# Patient Record
Sex: Female | Born: 2000 | Race: Black or African American | Hispanic: No | Marital: Single | Smoking: Light tobacco smoker
Health system: Southern US, Community
[De-identification: ages and names within clinical notes are randomized; demographics above are authoritative.]

## PROBLEM LIST (undated history)

## (undated) DIAGNOSIS — Z789 Other specified health status: Secondary | ICD-10-CM

## (undated) DIAGNOSIS — E669 Obesity, unspecified: Secondary | ICD-10-CM

## (undated) HISTORY — DX: Other specified health status: Z78.9

## (undated) HISTORY — PX: HERNIA REPAIR: SHX51

## (undated) HISTORY — DX: Obesity, unspecified: E66.9

---
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
## 2019-05-30 ENCOUNTER — Ambulatory Visit: Payer: Medicaid Other | Attending: Internal Medicine

## 2019-05-30 DIAGNOSIS — Z23 Encounter for immunization: Secondary | ICD-10-CM

## 2019-05-30 NOTE — Progress Notes (Signed)
   Covid-19 Vaccination Clinic  Name:  Tina Campos    MRN: [id number] DOB: 2000/08/01  05/30/2019  [name] was observed post Covid-19 immunization for 15 minutes without incident. She was provided with Vaccine Information Sheet and instruction to access the V-Safe system.   [name] was instructed to call 911 with any severe reactions post vaccine: Marland Kitchen Difficulty breathing  . Swelling of face and throat  . A fast heartbeat  . A bad rash all over body  . Dizziness and weakness   Immunizations Administered    Name Date Dose VIS Date Route   Moderna COVID-19 Vaccine 05/30/2019  9:59 AM 0.5 mL 01/22/2019 Intramuscular   Manufacturer: Moderna   Lot: [id number]   NDC: 76808-811-03

## 2019-07-02 ENCOUNTER — Ambulatory Visit: Payer: Medicaid Other | Attending: Family

## 2019-07-02 DIAGNOSIS — Z23 Encounter for immunization: Secondary | ICD-10-CM

## 2019-07-02 NOTE — Progress Notes (Signed)
   Covid-19 Vaccination Clinic  Name:  Tina Campos    MRN: [id number] DOB: [birth year]  07/02/2019  [name] was observed post Covid-19 immunization for 15 minutes without incident. She was provided with Vaccine Information Sheet and instruction to access the V-Safe system.   [name] was instructed to call 911 with any severe reactions post vaccine: Marland Kitchen Difficulty breathing  . Swelling of face and throat  . A fast heartbeat  . A bad rash all over body  . Dizziness and weakness   Immunizations Administered    Name Date Dose VIS Date Route   Moderna COVID-19 Vaccine 07/02/2019 10:10 AM 0.5 mL 01/2019 Intramuscular   Manufacturer: Moderna   Lot: [id number]   NDC: 94320-037-94

## 2019-07-19 ENCOUNTER — Emergency Department (HOSPITAL_COMMUNITY)
Admission: EM | Admit: 2019-07-19 | Discharge: 2019-07-20 | Disposition: A | Discharge status: Home / Self Care | Payer: Medicaid Other | Attending: Emergency Medicine | Admitting: Emergency Medicine

## 2019-07-19 ENCOUNTER — Encounter (HOSPITAL_COMMUNITY): Payer: Self-pay

## 2019-07-19 ENCOUNTER — Other Ambulatory Visit: Payer: Self-pay

## 2019-07-19 DIAGNOSIS — H9203 Otalgia, bilateral: Secondary | ICD-10-CM | POA: Diagnosis not present

## 2019-07-19 DIAGNOSIS — Z5321 Procedure and treatment not carried out due to patient leaving prior to being seen by health care provider: Secondary | ICD-10-CM | POA: Diagnosis not present

## 2019-07-19 NOTE — ED Triage Notes (Signed)
Pt states that for the past 3 days she has been having bilateral ear pain that is causing her to have a headache.

## 2019-07-20 NOTE — ED Notes (Signed)
Called x 1 no answer

## 2019-07-20 NOTE — ED Notes (Signed)
Called x2 no answer, used overhead.

## 2019-09-08 ENCOUNTER — Other Ambulatory Visit: Payer: Self-pay

## 2019-09-08 ENCOUNTER — Emergency Department (HOSPITAL_COMMUNITY)
Admission: EM | Admit: 2019-09-08 | Discharge: 2019-09-09 | Disposition: A | Discharge status: Home / Self Care | Payer: Medicaid Other | Attending: Emergency Medicine | Admitting: Emergency Medicine

## 2019-09-08 DIAGNOSIS — Z79899 Other long term (current) drug therapy: Secondary | ICD-10-CM | POA: Insufficient documentation

## 2019-09-08 DIAGNOSIS — R1030 Lower abdominal pain, unspecified: Secondary | ICD-10-CM | POA: Diagnosis not present

## 2019-09-08 DIAGNOSIS — K921 Melena: Secondary | ICD-10-CM | POA: Diagnosis not present

## 2019-09-08 DIAGNOSIS — K625 Hemorrhage of anus and rectum: Secondary | ICD-10-CM | POA: Diagnosis present

## 2019-09-08 LAB — COMPREHENSIVE METABOLIC PANEL
ALT: 17 U/L (ref 0–44)
AST: 20 U/L (ref 15–41)
Albumin: 3.6 g/dL (ref 3.5–5.0)
Alkaline Phosphatase: 61 U/L (ref 38–126)
Anion gap: 8 (ref 5–15)
BUN: 6 mg/dL (ref 6–20)
CO2: 25 mmol/L (ref 22–32)
Calcium: 9.2 mg/dL (ref 8.9–10.3)
Chloride: 108 mmol/L (ref 98–111)
Creatinine, Ser: 0.8 mg/dL (ref 0.44–1.00)
GFR calc Af Amer: >60 mL/min (ref >60)
GFR calc non Af Amer: >60 mL/min (ref >60)
Glucose, Bld: 80 mg/dL (ref 70–99)
Potassium: 3.9 mmol/L (ref 3.5–5.1)
Sodium: 141 mmol/L (ref 135–145)
Total Bilirubin: 0.5 mg/dL (ref 0.3–1.2)
Total Protein: 6.8 g/dL (ref 6.5–8.1)

## 2019-09-08 LAB — CBC
HCT: 34.8 % — ABNORMAL LOW (ref 36.0–46.0)
Hemoglobin: 10.8 g/dL — ABNORMAL LOW (ref 12.0–15.0)
MCH: 28.3 pg (ref 26.0–34.0)
MCHC: 31 g/dL (ref 30.0–36.0)
MCV: 91.3 fL (ref 80.0–100.0)
Platelets: 269 10*3/uL (ref 150–400)
RBC: 3.81 MIL/uL — ABNORMAL LOW (ref 3.87–5.11)
RDW: 12.9 % (ref 11.5–15.5)
WBC: 6.2 10*3/uL (ref 4.0–10.5)
nRBC: 0 % (ref 0.0–0.2)

## 2019-09-08 LAB — TYPE AND SCREEN
ABO/RH(D): O POS
Antibody Screen: NEGATIVE

## 2019-09-08 LAB — I-STAT BETA HCG BLOOD, ED (MC, WL, AP ONLY): I-stat hCG, quantitative: <5 m[IU]/mL (ref <5)

## 2019-09-08 LAB — ABO/RH: ABO/RH(D): O POS

## 2019-09-08 NOTE — ED Triage Notes (Signed)
Pt. Stated, Im havin blood in my stool.I saw it this morning

## 2019-09-09 LAB — CBC
HCT: 35.8 % — ABNORMAL LOW (ref 36.0–46.0)
Hemoglobin: 11.3 g/dL — ABNORMAL LOW (ref 12.0–15.0)
MCH: 29.2 pg (ref 26.0–34.0)
MCHC: 31.6 g/dL (ref 30.0–36.0)
MCV: 92.5 fL (ref 80.0–100.0)
Platelets: 289 10*3/uL (ref 150–400)
RBC: 3.87 MIL/uL (ref 3.87–5.11)
RDW: 13 % (ref 11.5–15.5)
WBC: 7.7 10*3/uL (ref 4.0–10.5)
nRBC: 0 % (ref 0.0–0.2)

## 2019-09-09 NOTE — ED Notes (Addendum)
Pt to rm35, complaining of rectal bleeding that started Sunday (7/18) morning. Pt claims that she had salad for dinner Saturday night, & around 0200 Sunday morning she ate approx half a bag of flaming hot cheetos. Pt states that she had 2 episodes of blood in stool Sunday (7/19). States that LMP ended 3 days ago (7/16)

## 2019-09-09 NOTE — ED Notes (Signed)
Pt unable to provide stool specimen at this time.

## 2019-09-09 NOTE — ED Notes (Signed)
Patient verbalizes understanding of discharge instructions. Opportunity for questioning and answers were provided. Armband removed by staff, pt discharged from ED stable & ambulatory  

## 2019-09-09 NOTE — Discharge Instructions (Addendum)
Your blood counts are reassuring.  If your symptoms change or worsen, please return to the ER.

## 2019-09-09 NOTE — ED Provider Notes (Signed)
[facility] EMERGENCY DEPARTMENT Provider Note   CSN: [id number] Arrival date & time: 09/08/19  1453     History Chief Complaint  Patient presents with  . Rectal Bleeding  . Abdominal Pain    Tina Campos is a 19 y.o. female.  Patient presents to the emergency department with a chief complaint of bloody stools.  She states that she had 2 episodes of bloody stool earlier today.  She reports some lower abdominal cramping.  She denies any fever, chills, nausea, or vomiting.  She denies any recent illnesses or exposures to anyone else that is sick.  She does report eating flaming hot she does yesterday.  She denies any other changes to her diet.  She denies any treatment prior to arrival.  The history is provided by the patient. No language interpreter was used.       Past Medical History:  Diagnosis Date  . Medical history non-contributory   . Obesity     Patient Active Problem List   Diagnosis Date Noted  . Adjustment disorder with depressed mood 01/07/2015  . Eczema 04/04/2014  . Recurrent boils 04/04/2014  . Unspecified constipation 03/28/2013  . Body mass index, pediatric, greater than or equal to 95th percentile for age 53/06/2013    Past Surgical History:  Procedure Laterality Date  . UMBILICAL HERNIA REPAIR  2005     OB History   No obstetric history on file.     Family History  Problem Relation Age of Onset  . Cancer Mother        cervical cancer    Social History   Tobacco Use  . Smoking status: Never Smoker  . Smokeless tobacco: Never Used  Substance Use Topics  . Alcohol use: No  . Drug use: Not Currently    Home Medications Prior to Admission medications   Medication Sig Start Date End Date Taking? Authorizing Provider  acetaminophen (TYLENOL) 500 MG tablet Take 1,000 mg by mouth daily as needed for mild pain or headache.   Yes [provider]  azelastine (ASTELIN) 0.1 % nasal spray Place 2 sprays into both  nostrils 2 (two) times daily. 07/20/19  Yes [provider]  Isopropyl Alcohol (SWIMMERS EAR DROPS OT) Place 5 drops in ear(s) as needed (congested ears).   Yes [provider]  Multiple Vitamin (MULTIVITAMIN WITH MINERALS) TABS tablet Take 1 tablet by mouth daily.   Yes [provider]  fluticasone (FLONASE) 50 MCG/ACT nasal spray Place 1 spray into both nostrils daily. 1 spray in each nostril every day Patient not taking: Reported on 04/04/2014 06/05/13   Jonetta Osgood, MD  hydrocortisone 2.5 % ointment Apply topically 2 (two) times daily as needed. for eczema Patient not taking: Reported on 01/07/2015 04/04/14   Tyrone Nine, MD  ibuprofen (ADVIL,MOTRIN) 600 MG tablet Take 1 tablet (600 mg total) by mouth every 8 (eight) hours as needed for cramping. Patient not taking: Reported on 01/07/2015 08/19/14   Perez-Fiery, Angelique Blonder, MD  ondansetron (ZOFRAN ODT) 4 MG disintegrating tablet Take 1 tablet (4 mg total) by mouth every 8 (eight) hours as needed for nausea or vomiting. Patient not taking: Reported on 09/09/2019 04/18/18   Roxy Horseman, PA-C  polyethylene glycol powder (GLYCOLAX/MIRALAX) powder Take 17 g by mouth daily. Patient not taking: Reported on 04/04/2014 03/28/13   Jonetta Osgood, MD  Vitamin D, Ergocalciferol, (DRISDOL) 50000 UNITS CAPS capsule Take 1 capsule (50,000 Units total) by mouth every 7 (seven) days. Patient not  taking: Reported on 09/09/2019 01/29/15   Jonetta Osgood, MD    Allergies    Patient has no known allergies.  Review of Systems   Review of Systems  All other systems reviewed and are negative.   Physical Exam Updated Vital Signs BP 118/73 (BP Location: Right Arm)   Pulse 62   Temp 98.3 F (36.8 C) (Oral)   Resp 17   Ht 5\' 4"  (1.626 m)   Wt 99.8 kg   LMP 09/05/2019   SpO2 100%   BMI 37.76 kg/m   Physical Exam Vitals and nursing note reviewed.  Constitutional:      General: She is not in acute distress.    Appearance: She is  well-developed.  HENT:     Head: Normocephalic and atraumatic.  Eyes:     Conjunctiva/sclera: Conjunctivae normal.  Cardiovascular:     Rate and Rhythm: Normal rate and regular rhythm.     Heart sounds: No murmur heard.   Pulmonary:     Effort: Pulmonary effort is normal. No respiratory distress.     Breath sounds: Normal breath sounds.  Abdominal:     Palpations: Abdomen is soft.     Tenderness: There is no abdominal tenderness.     Comments: No focal abdominal tenderness, no RLQ tenderness or pain at McBurney's point, no RUQ tenderness or Murphy's sign, no left-sided abdominal tenderness, no fluid wave, or signs of peritonitis   Musculoskeletal:     Cervical back: Neck supple.  Skin:    General: Skin is warm and dry.  Neurological:     Mental Status: She is alert and oriented to person, place, and time.  Psychiatric:        Mood and Affect: Mood normal.        Behavior: Behavior normal.     ED Results / Procedures / Treatments   Labs (all labs ordered are listed, but only abnormal results are displayed) Labs Reviewed  CBC - Abnormal; Notable for the following components:      Result Value   RBC 3.81 (*)    Hemoglobin 10.8 (*)    HCT 34.8 (*)    All other components within normal limits  CBC - Abnormal; Notable for the following components:   Hemoglobin 11.3 (*)    HCT 35.8 (*)    All other components within normal limits  GASTROINTESTINAL PANEL BY PCR, STOOL (REPLACES STOOL CULTURE)  COMPREHENSIVE METABOLIC PANEL  I-STAT BETA HCG BLOOD, ED ([facility], [facility], [facility] ONLY)  POC OCCULT BLOOD, ED  TYPE AND SCREEN  ABO/RH    EKG None  Radiology No results found.  Procedures Procedures (including critical care time)  Medications Ordered in ED Medications - No data to display  ED Course  I have reviewed the triage vital signs and the nursing notes.  Pertinent labs & imaging results that were available during my care of the patient were reviewed by me and considered in  my medical decision making (see chart for details).    MDM Rules/Calculators/A&P                          This patient complains of bloody stools, this involves an extensive number of treatment options, and is a complaint that carries with it a high risk of complications and morbidity.  The differential diagnosis includes colitis, diverticulitis, infectious diarrhea.  Pertinent Labs I ordered, reviewed, and interpreted labs, which included CBC notable for mild anemia, hemoglobin is 10.8,  but patient's baseline is in the low 11's.  No leukocytosis.  Electrolytes are reassuring.  Plan: We will recheck CBC given that the patient has been here for approximately 10 hours.  Patient needs to use the restroom now, will collect stool specimen and sent to lab.  If CBC is unchanged, plan for discharge to home.  Given patient is nontoxic appearance and reassuring blood counts, I suspect that the patient can likely be discharged home safely.  Her symptoms could be GI irritation from the flaming hot she does she ate.  Recommend close follow-up with PCP.  Repeat CBC shows hemoglobin of 11.3, which is baseline for the patient.  She was unable to provide a stool sample.  Final Clinical Impression(s) / ED Diagnoses Final diagnoses:  Bloody stool    Rx / DC Orders ED Discharge Orders    None       Roxy Horseman, PA-C 09/09/19 0203    Ward, Layla Maw, DO 09/09/19 [phone]

## 2019-10-14 ENCOUNTER — Emergency Department (HOSPITAL_COMMUNITY)
Admission: EM | Admit: 2019-10-14 | Discharge: 2019-10-14 | Disposition: A | Discharge status: Home / Self Care | Payer: Medicaid Other | Attending: Emergency Medicine | Admitting: Emergency Medicine

## 2019-10-14 ENCOUNTER — Other Ambulatory Visit: Payer: Self-pay

## 2019-10-14 DIAGNOSIS — Z5321 Procedure and treatment not carried out due to patient leaving prior to being seen by health care provider: Secondary | ICD-10-CM | POA: Insufficient documentation

## 2019-10-14 DIAGNOSIS — R109 Unspecified abdominal pain: Secondary | ICD-10-CM | POA: Diagnosis present

## 2019-10-14 NOTE — ED Triage Notes (Signed)
Patient states that she has had stomach issues over the past month, every time she eats she goes to the bathroom soon after 2-3 times, mix of diarrhea and hard stool. Multiple negative pregnancy tests, last period August 11th.

## 2020-01-30 IMAGING — CR CHEST - 2 VIEW
2 series · 2 of 2 positions shown · non-contrast
Comparison: None.

CLINICAL DATA: Chest pain

EXAM:
CHEST - 2 VIEW

[chest pa]
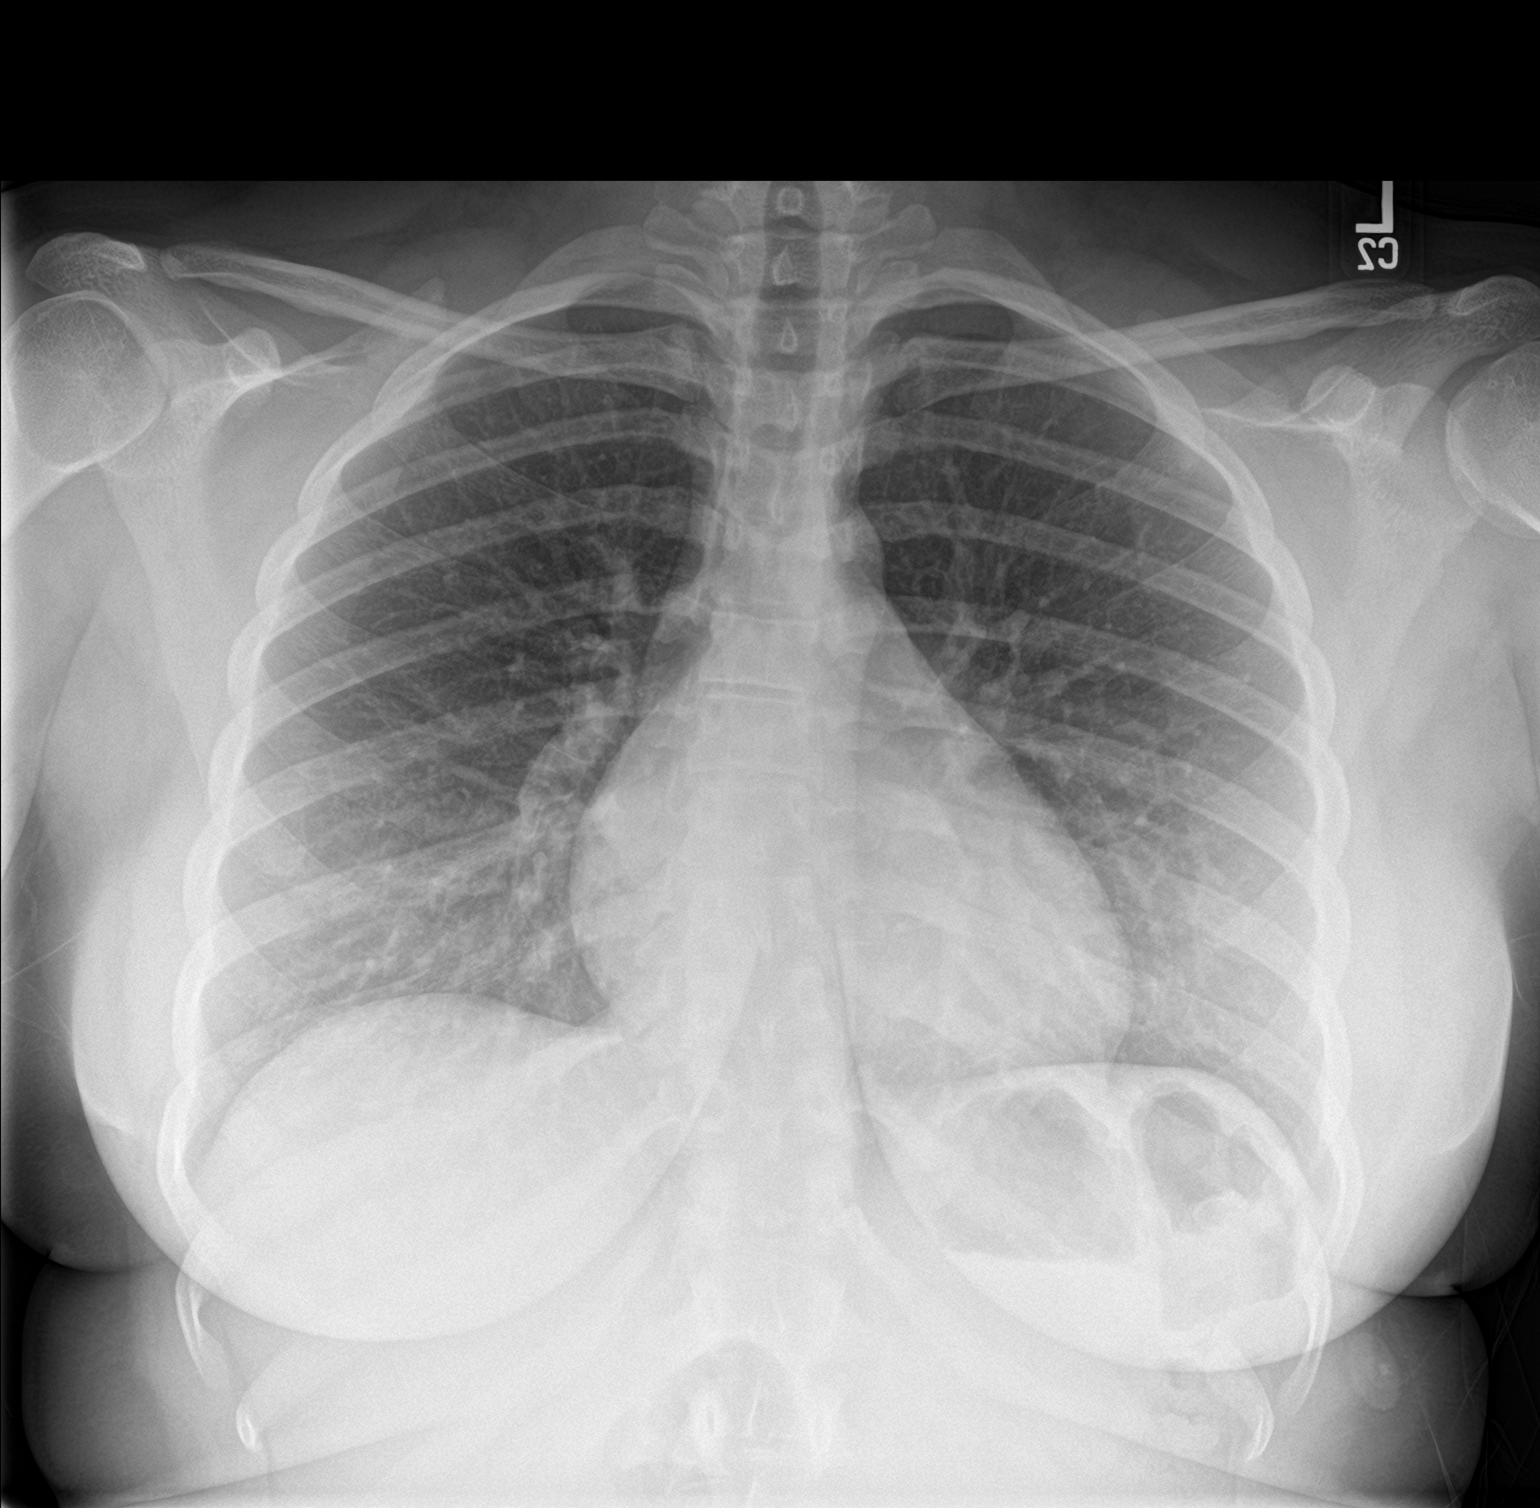

[chest lat]
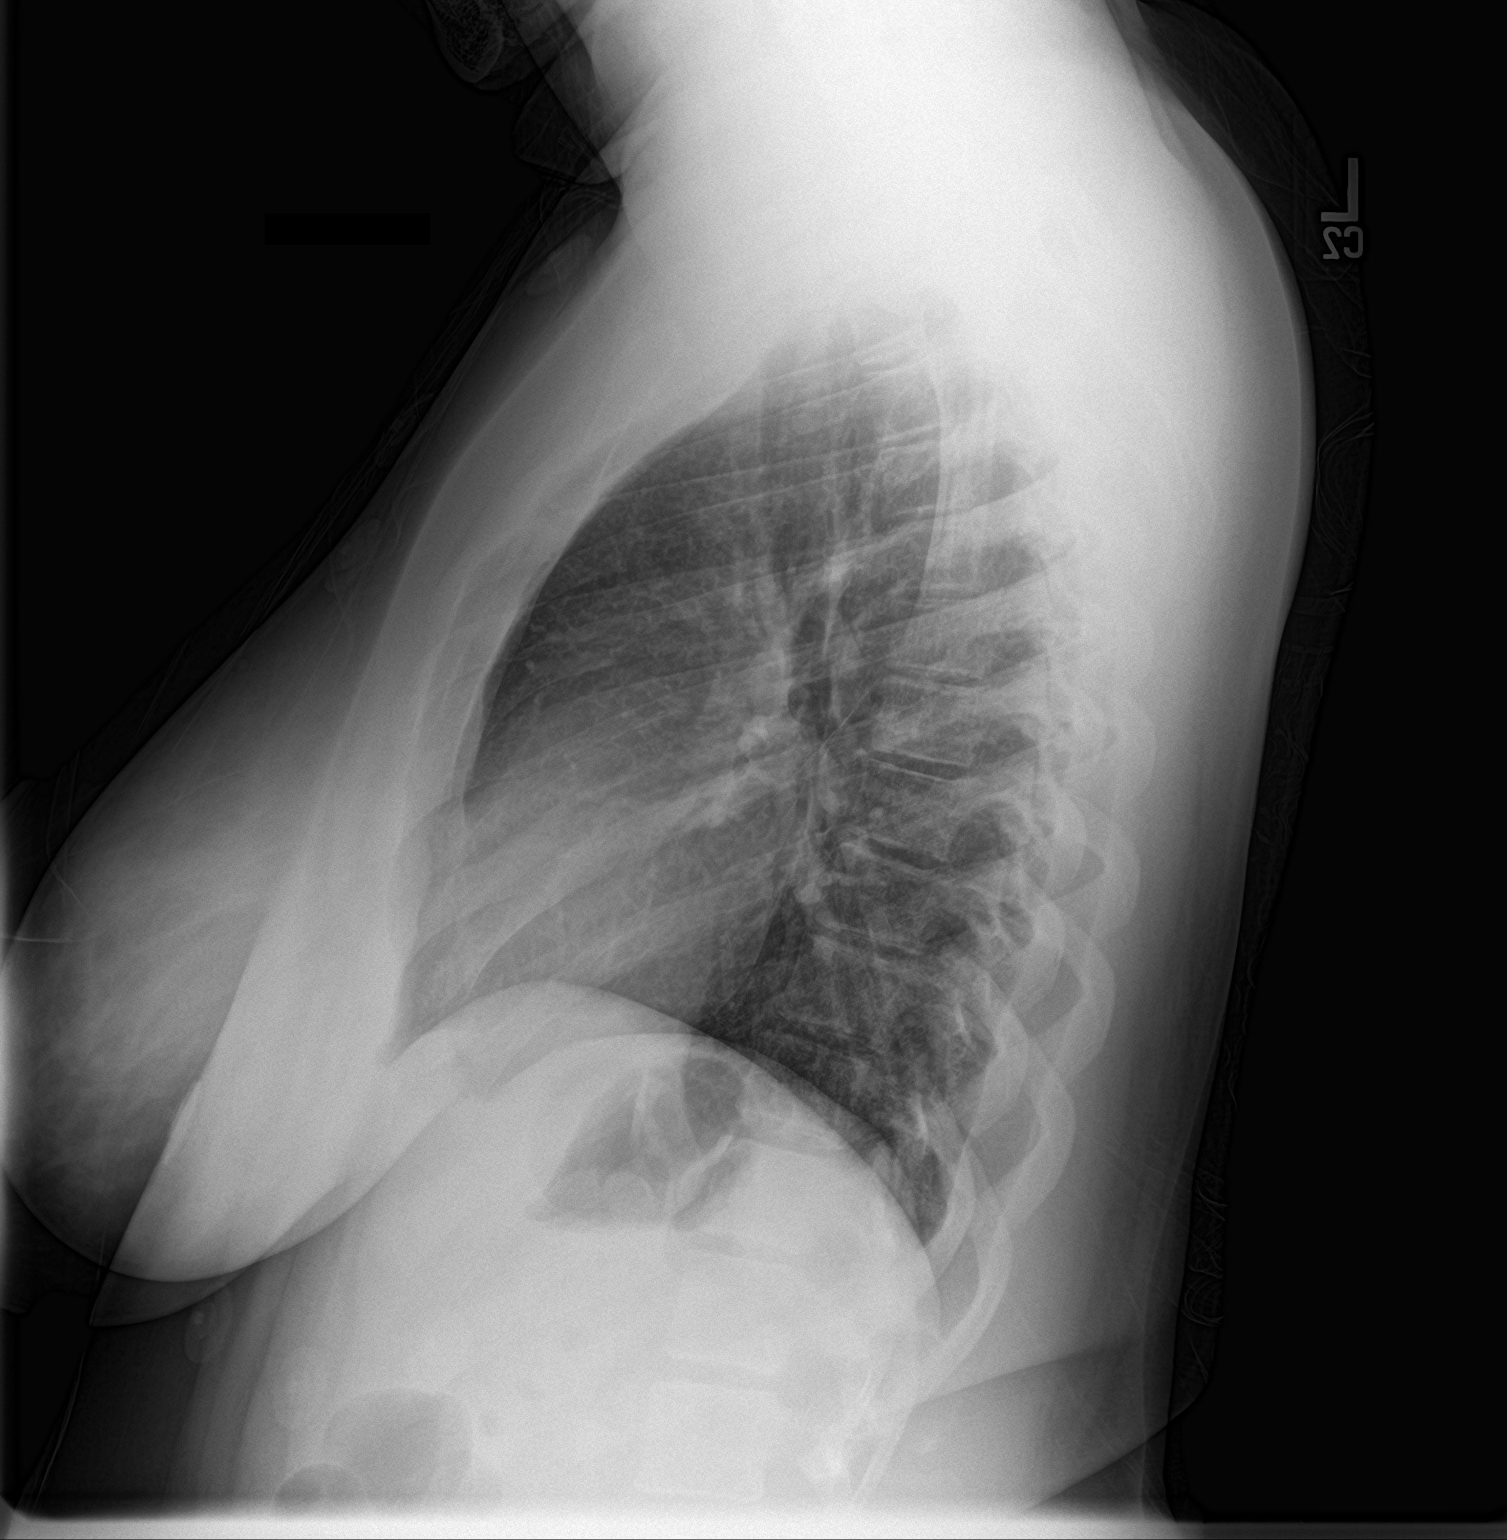

[2 of 2 positions shown; findings below may reference images not displayed]

FINDINGS: The heart size and mediastinal contours are within normal limits.
Both lungs are clear. The visualized skeletal structures are
unremarkable.
IMPRESSION: No active cardiopulmonary disease.

## 2020-06-14 ENCOUNTER — Encounter (HOSPITAL_COMMUNITY): Payer: Self-pay | Admitting: Emergency Medicine

## 2020-06-14 ENCOUNTER — Inpatient Hospital Stay (HOSPITAL_COMMUNITY)
Admission: AD | Admit: 2020-06-14 | Discharge: 2020-06-14 | Disposition: A | Discharge status: Home / Self Care | Payer: Medicaid Other | Attending: Obstetrics & Gynecology | Admitting: Obstetrics & Gynecology

## 2020-06-14 ENCOUNTER — Other Ambulatory Visit: Payer: Self-pay

## 2020-06-14 ENCOUNTER — Ambulatory Visit (HOSPITAL_COMMUNITY)
Admission: EM | Admit: 2020-06-14 | Discharge: 2020-06-14 | Disposition: A | Discharge status: Home / Self Care | Payer: Medicaid Other | Attending: Family Medicine | Admitting: Family Medicine

## 2020-06-14 DIAGNOSIS — R1031 Right lower quadrant pain: Secondary | ICD-10-CM | POA: Insufficient documentation

## 2020-06-14 DIAGNOSIS — N6452 Nipple discharge: Secondary | ICD-10-CM | POA: Diagnosis not present

## 2020-06-14 DIAGNOSIS — R109 Unspecified abdominal pain: Secondary | ICD-10-CM | POA: Diagnosis present

## 2020-06-14 DIAGNOSIS — R1032 Left lower quadrant pain: Secondary | ICD-10-CM | POA: Diagnosis not present

## 2020-06-14 DIAGNOSIS — N644 Mastodynia: Secondary | ICD-10-CM | POA: Diagnosis not present

## 2020-06-14 DIAGNOSIS — R519 Headache, unspecified: Secondary | ICD-10-CM | POA: Diagnosis not present

## 2020-06-14 DIAGNOSIS — N926 Irregular menstruation, unspecified: Secondary | ICD-10-CM | POA: Diagnosis not present

## 2020-06-14 DIAGNOSIS — Z3202 Encounter for pregnancy test, result negative: Secondary | ICD-10-CM | POA: Diagnosis not present

## 2020-06-14 LAB — POCT PREGNANCY, URINE: Preg Test, Ur: NEGATIVE

## 2020-06-14 NOTE — ED Provider Notes (Addendum)
[facility]    CSN: [id number] Arrival date & time: 06/14/20  1009      History   Chief Complaint Chief Complaint  Patient presents with  . Abdominal Cramping    HPI Tina Campos is a 20 y.o. female.   Patient presenting today with 1 week history of breast tenderness intermittently, lower abdominal and flank cramping, nausea, concerns of a very light and short menstrual cycle 2 weeks ago.  She states she typically has a very regular periods that last 5 to 7 days and on 05/31/2020 she had a light 4-day period that was more like spotting.  Denies fever, chills, vomiting diarrhea, vaginal pain or itching but does have some mild vaginal discharge.  She also notes that she squeezed her breast this morning in the shower and some liquid came out.  She does note she had unprotected sex last month and is not on any sort of contraception since 2 years ago.  She is concerned about her symptoms possibly being related to pregnancy.  This brought her to [facility] this morning where she was examined and pregnancy test/urine testing was benign and negative so she was sent here for further evaluation into her symptoms.  She does not currently have an OB/GYN.  Denies any recent exposures to STDs but does want to be tested today.     Past Medical History:  Diagnosis Date  . Medical history non-contributory   . Obesity     Patient Active Problem List   Diagnosis Date Noted  . Adjustment disorder with depressed mood 01/07/2015  . Eczema 04/04/2014  . Recurrent boils 04/04/2014  . Unspecified constipation 03/28/2013  . Body mass index, pediatric, greater than or equal to 95th percentile for age 34/06/2013    Past Surgical History:  Procedure Laterality Date  . UMBILICAL HERNIA REPAIR  2005    OB History   No obstetric history on file.      Home Medications    Prior to Admission medications   Medication Sig Start Date End Date Taking? Authorizing Provider   acetaminophen (TYLENOL) 500 MG tablet Take 1,000 mg by mouth daily as needed for mild pain or headache.   Yes [provider]  azelastine (ASTELIN) 0.1 % nasal spray Place 2 sprays into both nostrils 2 (two) times daily. 07/20/19   [provider]  fluticasone (FLONASE) 50 MCG/ACT nasal spray Place 1 spray into both nostrils daily. 1 spray in each nostril every day Patient not taking: No sig reported 06/05/13   Jonetta Osgood, MD  Isopropyl Alcohol (SWIMMERS EAR DROPS OT) Place 5 drops in ear(s) as needed (congested ears).    [provider]    Family History Family History  Problem Relation Age of Onset  . Cancer Mother        cervical cancer    Social History Social History   Tobacco Use  . Smoking status: Never Smoker  . Smokeless tobacco: Never Used  Vaping Use  . Vaping Use: Never used  Substance Use Topics  . Alcohol use: No  . Drug use: Not Currently     Allergies   Patient has no known allergies.   Review of Systems Review of Systems Per HPI  Physical Exam Triage Vital Signs ED Triage Vitals  Enc Vitals Group     BP 06/14/20 1045 111/72     Pulse Rate 06/14/20 1045 76     Resp 06/14/20 1045 16     Temp 06/14/20 1045  99 F (37.2 C)     Temp Source 06/14/20 1045 Oral     SpO2 06/14/20 1045 96 %     Weight --      Height --      Head Circumference --      Peak Flow --      Pain Score 06/14/20 1042 4     Pain Loc --      Pain Edu? --      Excl. in GC? --    No data found.  Updated Vital Signs BP 111/72 (BP Location: Left Arm)   Pulse 76   Temp 99 F (37.2 C) (Oral)   Resp 16   LMP 05/31/2020   SpO2 96%   Visual Acuity Right Eye Distance:   Left Eye Distance:   Bilateral Distance:    Right Eye Near:   Left Eye Near:    Bilateral Near:     Physical Exam Vitals and nursing note reviewed.  Constitutional:      Appearance: Normal appearance. She is not ill-appearing.  HENT:     Head: Atraumatic.      Mouth/Throat:     Mouth: Mucous membranes are moist.     Pharynx: Oropharynx is clear.  Eyes:     Extraocular Movements: Extraocular movements intact.     Conjunctiva/sclera: Conjunctivae normal.  Cardiovascular:     Rate and Rhythm: Normal rate and regular rhythm.     Heart sounds: Normal heart sounds.  Pulmonary:     Effort: Pulmonary effort is normal.     Breath sounds: Normal breath sounds.  Abdominal:     General: Bowel sounds are normal. There is no distension.     Palpations: Abdomen is soft.     Tenderness: There is no abdominal tenderness. There is no right CVA tenderness, left CVA tenderness or guarding.  Genitourinary:    Comments: GU exam deferred as she was just seen at [facility] ED this morning Musculoskeletal:        General: Normal range of motion.     Cervical back: Normal range of motion and neck supple.  Skin:    General: Skin is warm and dry.  Neurological:     Mental Status: She is alert and oriented to person, place, and time.  Psychiatric:        Mood and Affect: Mood normal.        Thought Content: Thought content normal.        Judgment: Judgment normal.      UC Treatments / Results  Labs (all labs ordered are listed, but only abnormal results are displayed) Labs Reviewed  CERVICOVAGINAL ANCILLARY ONLY    EKG   Radiology No results found.  Procedures Procedures (including critical care time)  Medications Ordered in UC Medications - No data to display  Initial Impression / Assessment and Plan / UC Course  I have reviewed the triage vital signs and the nursing notes.  Pertinent labs & imaging results that were available during my care of the patient were reviewed by me and considered in my medical decision making (see chart for details).     Exam and vitals reassuring today, labs done in women's ED showing negative urine pregnancy testing.  Reassurance given, discussed hormone changes and possible symptoms of this not related to  pregnancy.  Vaginal swab pending for rule out vaginal infections possibly causing some of her symptoms.  Did recommend using condoms or other birth control barrier methods as she is  not interested in hormonal therapy at this point given her past experiences.  May want to do pregnancy test about once a week at home until next.  For further reassurance as her last menstrual period was very irregular for her and following unprotected intercourse.  Resources given for OB/GYN for follow-up in the next few weeks for recheck.  Return for acutely worsening symptoms in the meantime.  Final Clinical Impressions(s) / UC Diagnoses   Final diagnoses:  Abnormal menstruation  Breast discharge  Abdominal cramping, bilateral lower quadrant   Discharge Instructions   None    ED Prescriptions    None     PDMP not reviewed this encounter.   Particia Nearing, [place] 06/14/20 1124    [address] [place], [place] 06/14/20 1125

## 2020-06-14 NOTE — ED Triage Notes (Addendum)
Pt presents today concerned with "pregnancy symptoms I.e. spotting, breast tenderness, cramping, nausea". She reports that she was seen at [facility] this am (preg test neg) and was sent here for a more in depth exam. She denies vaginal discharge, discomfort or dysuria.  LMP 05/06/20 reg LMP 05/31/20 spotting

## 2020-06-14 NOTE — MAU Note (Signed)
Pt reports to mau with c/o "pregnancy symptoms" such as tender breasts and headaches for the past few days.  Pt reports neg hpt a few weeks ago as well as a neg hcg that was done in a clinic.  Pt denies a missed period.  LMP 05/31/20

## 2020-06-14 NOTE — MAU Provider Note (Addendum)
Event Date/Time  First Provider Initiated Contact with Patient 06/14/20 [id number]     S Ms. Tina Campos is a 20 y.o. G0P0 patient who presents to MAU today requesting pregnancy confirmation due to complaints of headache, breast tenderness and abdominal cramping. Patient endorses LMP of 05/31/2020 and states she has not had sexual intercourse since that time.  Patient states she reported these complaints to her PCP at the end of March and her PCP told her to take a home pregnancy test "then go find an OB/GYN".  O BP 125/71 (BP Location: Right Arm)   Pulse 94   Temp 98.9 F (37.2 C) (Oral)   Resp 15   LMP 05/31/2020   SpO2 96%    Physical Exam Vitals and nursing note reviewed. Exam conducted with a chaperone present.  Constitutional:      Appearance: Normal appearance. She is not ill-appearing.  Cardiovascular:     Rate and Rhythm: Normal rate.     Pulses: Normal pulses.  Pulmonary:     Effort: Pulmonary effort is normal.  Neurological:     Mental Status: She is alert and oriented to person, place, and time.  Psychiatric:        Mood and Affect: Mood normal.        Behavior: Behavior normal.        Thought Content: Thought content normal.        Judgment: Judgment normal.    A Medical screening exam complete LMP 05/31/2020 Not sexually active since LMP Negative home pregnancy tests Negative urine pregnancy test in MAU  P Discharge from MAU in stable condition Patient given the option of transfer to [facility] for further evaluation or seek care in outpatient facility of choice Patient may return to MAU as needed for pregnancy-related emergencies Discussed availability of walk-in pregnancy testing at [facility] offices during business week Patient verbalizes intent to present to Urgent Care for further evaluation  Clayton Bibles, CNM 06/14/2020 10:14 AM

## 2020-06-14 NOTE — Discharge Instructions (Signed)
Home Pregnancy Test Information  Why am I having this test? A home pregnancy test helps you determine whether you are pregnant or not. There are several types of home pregnancy tests that can be bought at a store or pharmacy. Home pregnancy tests are very accurate when:  You are at least 3-[redacted] weeks pregnant.  It has been 1-2 weeks since your missed period.  You use the test according to the package instructions. What is being tested? A home pregnancy test detects the presence of a hormone called human chorionic gonadotropin (hCG) in your urine. HCG is produced by cells of the placenta. The placenta is the organ that forms to nourish and support a developing baby. What kind of sample is taken? Home pregnancy tests require a urine sample. How do I collect samples at home? Most kits use a plastic testing device with a strip of paper that indicates whether there is hCG in your urine. Follow the test package instructions very carefully for how to test your urine. Depending on the test, you may need to:  Urinate directly onto the stick.  Urinate into a cup. Wait for the results as directed by the package instructions. The amount of time may be different for each type of test. How do I prepare for this test? For best results, collect the sample the first time you urinate in the morning. This when the concentration of hCG is highest. How are the results reported? Follow the test package instructions for how to read your test results. Depending on the test, results may be displayed as:  A plus or a minus sign.  One or two lines.  "Pregnant" or "not pregnant." What do the results mean? Positive pregnancy test result A positive home pregnancy test means that you are pregnant. It is important to schedule an appointment with your health care provider to start prenatal care. Your health care provider may perform additional testing to confirm the pregnancy and to determine that you have a normal  pregnancy. Negative pregnancy test result If you have a negative home pregnancy test you may want to wait a few days and then repeat the home pregnancy test. Many kits contain a second test as a back-up. If you have a negative test and also have symptoms of pregnancy, contact your health care provider. Your health care provider can test a sample of your blood to check for pregnancy. A blood test may return a positive result even if a urine test was negative because blood tests are more accurate. This means blood tests can detect hCG earlier than urine tests. Talk with your health care provider about what your results mean. Some things to know about a home pregnancy test Sometimes, a home pregnancy test may report that you are pregnant when you are not pregnant (false-positive result). This can happen if you:  Are taking certain medicines, such as: ? Medicine to control seizures. ? Anti-anxiety medicine. ? Fertility medicine with hCG.  Have a medical condition that affects your hormone levels.  Had a recent pregnancy loss (miscarriage) or abortion. Sometimes, a home pregnancy test may report that you are not pregnant when you are pregnant (false-negative result). This can happen if you:  Take the test too early in your pregnancy. Before 3-4 weeks of pregnancy, there may not be enough hCG to detect.  Drink a lot of liquid before the test.  Use an expired pregnancy test.  Are taking certain medicines, such as antihistamines or water pills (diuretics). Questions to  ask your health care provider Ask your health care provider, or the department that is doing the test:  When will my results be ready?  How will I get my results?  What are my treatment options?  What other tests do I need?  What are my next steps? Summary  A home pregnancy test helps you determine whether you are pregnant or not by detecting the presence of the hormone human chorionic gonadotropin (hCG) in a sample of  your urine.  Follow the test package instructions very carefully. For best results, collect the sample the first time you urinate in the morning. This when the concentration of hCG is highest.  Home pregnancy tests are very accurate when you are 3-[redacted] weeks pregnant or when it has been 1-2 weeks since your missed period.  A positive home pregnancy test means that you are pregnant. It is important to schedule an appointment with your health care provider to start prenatal care. This information is not intended to replace advice given to you by your health care provider. Make sure you discuss any questions you have with your health care provider. Document Revised: 11/11/2019 Document Reviewed: 11/11/2019 Elsevier Patient Education  2021 Elsevier Inc.    

## 2020-06-15 LAB — CERVICOVAGINAL ANCILLARY ONLY
Bacterial Vaginitis (gardnerella): NEGATIVE
Candida Glabrata: NEGATIVE
Candida Vaginitis: POSITIVE — AB
Chlamydia: NEGATIVE
Comment: NEGATIVE
Comment: NEGATIVE
Comment: NEGATIVE
Comment: NEGATIVE
Comment: NEGATIVE
Comment: NORMAL
Neisseria Gonorrhea: NEGATIVE
Trichomonas: NEGATIVE

## 2020-06-17 ENCOUNTER — Telehealth (HOSPITAL_COMMUNITY): Payer: Self-pay | Admitting: Emergency Medicine

## 2020-06-17 MED ORDER — FLUCONAZOLE 150 MG PO TABS: 150.0000 mg | ORAL_TABLET | Freq: Once | ORAL | 0 refills | Status: AC

## 2020-06-24 ENCOUNTER — Ambulatory Visit (INDEPENDENT_AMBULATORY_CARE_PROVIDER_SITE_OTHER): Payer: Medicaid Other | Admitting: Certified Nurse Midwife

## 2020-06-24 ENCOUNTER — Encounter: Payer: Self-pay | Admitting: Certified Nurse Midwife

## 2020-06-24 ENCOUNTER — Other Ambulatory Visit: Payer: Self-pay

## 2020-06-24 VITALS — BP 118/69 | HR 71 | Wt 198.1 lb

## 2020-06-24 DIAGNOSIS — N926 Irregular menstruation, unspecified: Secondary | ICD-10-CM | POA: Diagnosis not present

## 2020-06-24 DIAGNOSIS — N6452 Nipple discharge: Secondary | ICD-10-CM

## 2020-06-24 NOTE — Progress Notes (Signed)
Tina Campos is here today in regards to an "abnormal period". She stated that she is usually on the  24-26 cycle but has gotten her period earlier than expected (4 days earlier). She also complains of stomach cramps, headaches, nausea and back pains. Patient mentioned that she had unprotected sec March 26, 22 and symptoms started sometime after. She has taken a pregnancy test in the beginning of April and it cam back negative. LMP is 06/21/20, patient stated it's much lighter than usual.   Henya Aguallo, CMA

## 2020-06-25 LAB — THYROID PANEL WITH TSH
Free Thyroxine Index: 2 (ref 1.2–4.9)
T3 Uptake Ratio: 26 % (ref 24–39)
T4, Total: 7.8 ug/dL (ref 4.5–12.0)
TSH: 0.75 u[IU]/mL (ref 0.450–4.500)

## 2020-06-25 LAB — HEMOGLOBIN A1C
Est. average glucose Bld gHb Est-mCnc: 108 mg/dL
Hgb A1c MFr Bld: 5.4 % (ref 4.8–5.6)

## 2020-06-25 LAB — VITAMIN D 25 HYDROXY (VIT D DEFICIENCY, FRACTURES): Vit D, 25-Hydroxy: 17.3 ng/mL — ABNORMAL LOW (ref 30.0–100.0)

## 2020-06-25 LAB — PROLACTIN: Prolactin: 8 ng/mL (ref 4.8–23.3)

## 2020-06-25 NOTE — Progress Notes (Signed)
History:  Tina Campos is a 20 y.o. G0P0 who presents to clinic today for abnormal menstrual cycles - cycles range from q21-27 days x43yrs. Currently having symptoms she fears are pregnancy despite negative UPT and bHCG testing. Feeling intermittently nauseated, has occasional lower abdominal pain (diffuse and with periods), breast tenderness (prior to period) and can express "something" when breasts are sore. Discharge only happens with expression. Also reports feeling overheated often, weight loss (reports 30lbs in two months), and moodiness.   The following portions of the patient's history were reviewed and updated as appropriate: allergies, current medications, family history, past medical history, social history, past surgical history and problem list.  Review of Systems:  Review of Systems  Constitutional: Positive for malaise/fatigue and weight loss.  HENT: Negative for congestion and sore throat.   Eyes: Negative for blurred vision.  Cardiovascular: Positive for palpitations (with exertion).  Gastrointestinal: Positive for abdominal pain and nausea. Negative for constipation, diarrhea, heartburn and vomiting.  Skin: Negative for itching and rash.     Objective:  Physical Exam BP 118/69   Pulse 71   Wt 198 lb 1.6 oz (89.9 kg)   LMP 06/21/2020 (Exact Date)   BMI 34.00 kg/m  Physical Exam Vitals and nursing note reviewed. Exam conducted with a chaperone present.  Constitutional:      General: She is not in acute distress.    Appearance: She is not ill-appearing.  HENT:     Head: Normocephalic and atraumatic.     Mouth/Throat:     Mouth: Mucous membranes are moist.  Eyes:     Pupils: Pupils are equal, round, and reactive to light.  Neck:     Thyroid: No thyroid mass, thyromegaly or thyroid tenderness.  Cardiovascular:     Rate and Rhythm: Normal rate and regular rhythm.     Pulses: Normal pulses.  Pulmonary:     Effort: Pulmonary effort is normal.  Chest:  Breasts:      Right: Nipple discharge (with expression) and tenderness present.     Left: Tenderness present. No nipple discharge.      Comments: Normal glandular tissue felt, no abnormality noted or lymphadenopathy Abdominal:     Palpations: Abdomen is soft.  Musculoskeletal:        General: Normal range of motion.  Lymphadenopathy:     Cervical: No cervical adenopathy.  Skin:    General: Skin is warm and dry.     Capillary Refill: Capillary refill takes less than 2 seconds.  Neurological:     Mental Status: She is alert and oriented to person, place, and time.  Psychiatric:        Mood and Affect: Mood normal.        Behavior: Behavior normal.        Thought Content: Thought content normal.        Judgment: Judgment normal.    Assessment & Plan:  1. Abnormal menstruation - Reassured that cycles are normal even with the variation - appears that about 2-3x/yr she will have a 21 day cycle. Advised that cycle length can vary based on dietary intake, stress levels, etc.  - Reassurance given that pt is not pregnant - Thyroid Panel With TSH - Hemoglobin A1c - Vitamin D (25 hydroxy) (has a history of decreased vitamin D)  2. Nipple discharge in female - Prolactin - Reassured that some nipple discharge around cycle can be normal esp when it is only present if expressed. Advised to do monthly breast self-exams  and not to express. - Will follow bloodwork and manage accordingly.  Bernerd Limbo, [place] 06/25/2020 7:56 PM

## 2020-06-26 ENCOUNTER — Encounter: Payer: Self-pay | Admitting: Certified Nurse Midwife

## 2020-10-08 ENCOUNTER — Telehealth: Payer: Medicaid Other | Admitting: Physician Assistant

## 2020-10-08 ENCOUNTER — Ambulatory Visit (HOSPITAL_COMMUNITY): Payer: Medicaid Other

## 2020-10-08 DIAGNOSIS — B3731 Acute candidiasis of vulva and vagina: Secondary | ICD-10-CM

## 2020-10-08 DIAGNOSIS — B373 Candidiasis of vulva and vagina: Secondary | ICD-10-CM

## 2020-10-08 MED ORDER — FLUCONAZOLE 150 MG PO TABS: 150.0000 mg | ORAL_TABLET | Freq: Once | ORAL | 0 refills | Status: AC

## 2020-10-08 NOTE — Progress Notes (Signed)

## 2020-10-08 NOTE — Progress Notes (Signed)
I have spent 5 minutes in review of e-visit questionnaire, review and updating patient chart, medical decision making and response to patient.   Ranson Belluomini Cody Rogene Meth, PA-C    

## 2020-11-02 ENCOUNTER — Telehealth: Payer: Medicaid Other | Admitting: Nurse Practitioner

## 2020-11-02 DIAGNOSIS — B3731 Acute candidiasis of vulva and vagina: Secondary | ICD-10-CM

## 2020-11-02 DIAGNOSIS — B373 Candidiasis of vulva and vagina: Secondary | ICD-10-CM

## 2020-11-02 NOTE — Progress Notes (Signed)

## 2020-11-03 MED ORDER — FLUCONAZOLE 150 MG PO TABS: 150.0000 mg | ORAL_TABLET | Freq: Once | ORAL | 0 refills | Status: AC

## 2020-11-03 NOTE — Addendum Note (Signed)
Addended by: Viviano Simas E on: 11/03/2020 04:58 PM   Modules accepted: Orders

## 2020-12-03 ENCOUNTER — Other Ambulatory Visit (HOSPITAL_COMMUNITY)
Admission: RE | Admit: 2020-12-03 | Discharge: 2020-12-03 | Disposition: A | Discharge status: Home / Self Care | Payer: Medicaid Other | Source: Ambulatory Visit | Attending: Certified Nurse Midwife | Admitting: Certified Nurse Midwife

## 2020-12-03 ENCOUNTER — Ambulatory Visit (INDEPENDENT_AMBULATORY_CARE_PROVIDER_SITE_OTHER): Payer: Medicaid Other | Admitting: Certified Nurse Midwife

## 2020-12-03 ENCOUNTER — Encounter: Payer: Self-pay | Admitting: Certified Nurse Midwife

## 2020-12-03 ENCOUNTER — Other Ambulatory Visit: Payer: Self-pay

## 2020-12-03 VITALS — BP 123/64 | HR 64 | Ht 64.0 in | Wt 203.6 lb

## 2020-12-03 DIAGNOSIS — Z01419 Encounter for gynecological examination (general) (routine) without abnormal findings: Secondary | ICD-10-CM

## 2020-12-03 NOTE — Progress Notes (Signed)
Patient here for PAP and breast exam. States she has been having possible yeast concerns. Patient is self pay, PAP scheduled through [facility], will complete swabs today.  Wynona Canes, CMA

## 2020-12-04 LAB — CERVICOVAGINAL ANCILLARY ONLY
Bacterial Vaginitis (gardnerella): NEGATIVE
Candida Glabrata: NEGATIVE
Candida Vaginitis: NEGATIVE
Chlamydia: NEGATIVE
Comment: NEGATIVE
Comment: NEGATIVE
Comment: NEGATIVE
Comment: NEGATIVE
Comment: NEGATIVE
Comment: NORMAL
Neisseria Gonorrhea: NEGATIVE
Trichomonas: NEGATIVE

## 2020-12-04 LAB — HEMOGLOBIN A1C
Est. average glucose Bld gHb Est-mCnc: 111 mg/dL
Hgb A1c MFr Bld: 5.5 % (ref 4.8–5.6)

## 2020-12-04 LAB — THYROID PANEL WITH TSH
Free Thyroxine Index: 1.8 (ref 1.2–4.9)
T3 Uptake Ratio: 24 % (ref 24–39)
T4, Total: 7.5 ug/dL (ref 4.5–12.0)
TSH: 0.939 u[IU]/mL (ref 0.450–4.500)

## 2020-12-04 LAB — HIV ANTIBODY (ROUTINE TESTING W REFLEX): HIV Screen 4th Generation wRfx: NONREACTIVE

## 2020-12-04 LAB — PROLACTIN: Prolactin: 5.9 ng/mL (ref 4.8–23.3)

## 2020-12-04 NOTE — Progress Notes (Signed)
GYNECOLOGY CLINIC ANNUAL PREVENTATIVE CARE ENCOUNTER NOTE  Subjective:   Tina Campos is a 20 y.o. nulliparous female here for a routine annual gynecologic exam.  Current complaints: reports occasional nipple discharge (with expression) in the two weeks prior to her cycle, many GI issues - had a stomach virus and was given bentyl for the cramping but notes she still occasionally has stomach pain and throws up. Denies abnormal vaginal bleeding, discharge, pelvic pain, problems with intercourse or other gynecologic concerns.    Gynecologic History Patient's last menstrual period was 12/02/2020 (exact date). Contraception: abstinence Last Pap: Never (age).  Last mammogram: Never (age)  Obstetric History OB History  No obstetric history on file.    Past Medical History:  Diagnosis Date   Body mass index, pediatric, greater than or equal to 95th percentile for age 58/06/2013   Eczema 04/04/2014   Medical history non-contributory    Obesity    Recurrent boils 04/04/2014   Unspecified constipation 03/28/2013    Past Surgical History:  Procedure Laterality Date   HERNIA REPAIR     UMBILICAL HERNIA REPAIR  2005    Current Outpatient Medications on File Prior to Visit  Medication Sig Dispense Refill   acetaminophen (TYLENOL) 500 MG tablet Take 1,000 mg by mouth daily as needed for mild pain or headache. (Patient not taking: No sig reported)     azelastine (ASTELIN) 0.1 % nasal spray Place 2 sprays into both nostrils 2 (two) times daily. (Patient not taking: No sig reported)     buPROPion (WELLBUTRIN XL) 150 MG 24 hr tablet Take 1 tablet by mouth at bedtime. (Patient not taking: No sig reported)     clindamycin (CLEOCIN T) 1 % external solution Apply topically. (Patient not taking: No sig reported)     clindamycin (CLEOCIN T) 1 % external solution Apply to chin, underarms and groin daily to twice daily (Patient not taking: No sig reported)     Dapsone 7.5 % GEL Apply topically at  bedtime. (Patient not taking: No sig reported)     Dapsone 7.5 % GEL Apply to face nightly. (Patient not taking: No sig reported)     dicyclomine (BENTYL) 10 MG capsule Take by mouth. (Patient not taking: No sig reported)     fluticasone (FLONASE) 50 MCG/ACT nasal spray Place 1 spray into both nostrils daily. 1 spray in each nostril every day (Patient not taking: No sig reported) 16 g 12   hydrocortisone 2.5 % cream Apply topically. (Patient not taking: No sig reported)     hydrocortisone 2.5 % cream Apply to affected areas of neck twice daily for 2 wk, then daily as needed for the next 3 wks. Repeat if needed. (Patient not taking: No sig reported)     hydrOXYzine (ATARAX/VISTARIL) 25 MG tablet Take 25 mg by mouth 3 (three) times daily. (Patient not taking: No sig reported)     Isopropyl Alcohol (SWIMMERS EAR DROPS OT) Place 5 drops in ear(s) as needed (congested ears). (Patient not taking: No sig reported)     ondansetron (ZOFRAN) 4 MG tablet Take by mouth. (Patient not taking: No sig reported)     No current facility-administered medications on file prior to visit.    No Known Allergies  Social History   Socioeconomic History   Marital status: Single    Spouse name: Not on file   Number of children: Not on file   Years of education: Not on file   Highest education level: Not on  file  Occupational History   Not on file  Tobacco Use   Smoking status: Light Smoker    Packs/day: 1.00    Types: Cigarettes   Smokeless tobacco: Never  Vaping Use   Vaping Use: Former  Substance and Sexual Activity   Alcohol use: No   Drug use: Not Currently    Types: Marijuana   Sexual activity: Yes    Birth control/protection: None  Other Topics Concern   Not on file  Social History Narrative   Not on file   Social Determinants of Health   Financial Resource Strain: Not on file  Food Insecurity: Food Insecurity Present   Worried About Running Out of Food in the Last Year: Sometimes true    Ran Out of Food in the Last Year: Sometimes true  Transportation Needs: No Transportation Needs   Lack of Transportation (Medical): No   Lack of Transportation (Non-Medical): No  Physical Activity: Not on file  Stress: Not on file  Social Connections: Not on file  Intimate Partner Violence: Not on file    Family History  Problem Relation Age of Onset   Cancer Mother        cervical cancer   Diabetes Father    Cancer Maternal Aunt     The following portions of the patient's history were reviewed and updated as appropriate: allergies, current medications, past family history, past medical history, past social history, past surgical history and problem list.  Review of Systems Pertinent items noted in HPI and remainder of comprehensive ROS otherwise negative.   Objective:  BP 123/64   Pulse 64   Ht 5\' 4"  (1.626 m)   Wt 203 lb 9.6 oz (92.4 kg)   LMP 12/02/2020 (Exact Date)   BMI 34.95 kg/m  CONSTITUTIONAL: Well-developed, well-nourished female in no acute distress.  HENT:  Normocephalic, atraumatic, External right and left ear normal.  EYES: Conjunctivae and EOM are normal. Pupils are equal, round, and reactive to light. No scleral icterus.  NECK: Normal range of motion, normal thyroid.  SKIN: Skin is warm and dry. No rash noted. Not diaphoretic. No erythema. No pallor. NEUROLGIC: Alert and oriented to person, place, and time. Normal reflexes, muscle tone coordination. No cranial nerve deficit noted. PSYCHIATRIC: Normal mood and affect. Normal behavior. Normal judgment and thought content. CARDIOVASCULAR: Normal heart rate noted, regular rhythm RESPIRATORY: Effort normal, no problems with respiration noted. BREASTS: Symmetric in size. No masses, skin changes, nipple drainage, or lymphadenopathy. Taught pt to do self-breast exam and encouraged her not to attempt expression. ABDOMEN: Soft, no distention noted.  No tenderness, rebound or guarding.  PELVIC: Normal appearing external  genitalia; normal appearing vaginal mucosa and cervix.  No abnormal discharge noted.  Pap smear obtained.  Normal uterine size, no other palpable masses, no uterine or adnexal tenderness. MUSCULOSKELETAL: Normal range of motion. No tenderness.  No cyanosis, clubbing, or edema.  2+ distal pulses.  Assessment:  Annual gynecologic examination with pap smear   Plan:  Will follow up results of pap smear and manage accordingly. - pt reassured that small amount of nipple discharge with expression prior to menses can be normal when there are no lumps (appropriate glandular tissue) and labs are normal. - prolactin, thyroid panel and A1C drawn (all normal) Routine preventative health maintenance measures emphasized.  Follow up PRN or in one year for annual exam  02/01/2021, CNM

## 2021-01-07 ENCOUNTER — Telehealth: Payer: Medicaid Other | Admitting: Physician Assistant

## 2021-01-07 DIAGNOSIS — B3731 Acute candidiasis of vulva and vagina: Secondary | ICD-10-CM

## 2021-01-07 DIAGNOSIS — Z3200 Encounter for pregnancy test, result unknown: Secondary | ICD-10-CM

## 2021-01-08 NOTE — Progress Notes (Signed)
Based on what you shared with me, I feel your condition warrants further evaluation and I recommend that you be seen in a face to face visit with your gynecologist or at one of our [facility] clinics.  Being that there is a possibility you may be pregnant, this needs to be confirmed before we can prescribe medications as most oral medications for yeast infection are NOT safe to take during pregnancy.    NOTE: There will be NO CHARGE for this eVisit   If you are having a true medical emergency please call 911.    *Center for [facility]             [address] [place], [place] [address] [phone] (*Take patients with no insurance)  *Center for [employer] at [facility] [address] [place], [place],  [place]  [address] [phone] (*Take patients with no insurance)  Center for [employer] at [facility]                                                             [address], [address], [place], [place], [address] [phone]  Center for [facility] at [facility] [address], [place], [place], [address] [phone]  Center for [facility] at [facility] [address], [place], [place], [address] [phone]  Center for [facility] at [facility]                                 [address] [place], [place], [place], [address] [phone]  Center for [facility] at [facility]                                    [address] [place], [place], [address] [phone]  Center for [facility] at [facility] [address], [place], [place], [address]                              [facility] [place] [address], [place], [place], [address] [phone]  Your MyChart E-visit questionnaire answers were reviewed by a board certified advanced clinical practitioner to complete your personal  care plan based on your specific symptoms.  Thank you for using e-Visits.   I provided 5 minutes of non face-to-face time during this encounter for chart review and documentation.

## 2021-01-31 ENCOUNTER — Other Ambulatory Visit: Payer: Self-pay

## 2021-01-31 ENCOUNTER — Encounter (HOSPITAL_COMMUNITY): Payer: Self-pay | Admitting: Emergency Medicine

## 2021-01-31 ENCOUNTER — Emergency Department (HOSPITAL_COMMUNITY)
Admission: EM | Admit: 2021-01-31 | Discharge: 2021-01-31 | Disposition: A | Discharge status: Home / Self Care | Payer: Medicaid Other | Attending: Emergency Medicine | Admitting: Emergency Medicine

## 2021-01-31 ENCOUNTER — Emergency Department (HOSPITAL_COMMUNITY): Payer: Medicaid Other

## 2021-01-31 DIAGNOSIS — R1011 Right upper quadrant pain: Secondary | ICD-10-CM | POA: Diagnosis not present

## 2021-01-31 DIAGNOSIS — N9489 Other specified conditions associated with female genital organs and menstrual cycle: Secondary | ICD-10-CM | POA: Insufficient documentation

## 2021-01-31 DIAGNOSIS — F17210 Nicotine dependence, cigarettes, uncomplicated: Secondary | ICD-10-CM | POA: Diagnosis not present

## 2021-01-31 DIAGNOSIS — R1084 Generalized abdominal pain: Secondary | ICD-10-CM

## 2021-01-31 DIAGNOSIS — R1031 Right lower quadrant pain: Secondary | ICD-10-CM | POA: Insufficient documentation

## 2021-01-31 LAB — COMPREHENSIVE METABOLIC PANEL
ALT: 12 U/L (ref 0–44)
AST: 18 U/L (ref 15–41)
Albumin: 4 g/dL (ref 3.5–5.0)
Alkaline Phosphatase: 54 U/L (ref 38–126)
Anion gap: 7 (ref 5–15)
BUN: 7 mg/dL (ref 6–20)
CO2: 24 mmol/L (ref 22–32)
Calcium: 9 mg/dL (ref 8.9–10.3)
Chloride: 107 mmol/L (ref 98–111)
Creatinine, Ser: 0.91 mg/dL (ref 0.44–1.00)
GFR, Estimated: >60 mL/min (ref >60)
Glucose, Bld: 88 mg/dL (ref 70–99)
Potassium: 3.8 mmol/L (ref 3.5–5.1)
Sodium: 138 mmol/L (ref 135–145)
Total Bilirubin: 0.6 mg/dL (ref 0.3–1.2)
Total Protein: 6.6 g/dL (ref 6.5–8.1)

## 2021-01-31 LAB — LIPASE, BLOOD: Lipase: 26 U/L (ref 11–51)

## 2021-01-31 LAB — CBC WITH DIFFERENTIAL/PLATELET
Abs Immature Granulocytes: 0.01 10*3/uL (ref 0.00–0.07)
Basophils Absolute: 0 10*3/uL (ref 0.0–0.1)
Basophils Relative: 0 %
Eosinophils Absolute: 0.1 10*3/uL (ref 0.0–0.5)
Eosinophils Relative: 1 %
HCT: 35.8 % — ABNORMAL LOW (ref 36.0–46.0)
Hemoglobin: 11.7 g/dL — ABNORMAL LOW (ref 12.0–15.0)
Immature Granulocytes: 0 %
Lymphocytes Relative: 42 %
Lymphs Abs: 2.9 10*3/uL (ref 0.7–4.0)
MCH: 29.4 pg (ref 26.0–34.0)
MCHC: 32.7 g/dL (ref 30.0–36.0)
MCV: 89.9 fL (ref 80.0–100.0)
Monocytes Absolute: 0.5 10*3/uL (ref 0.1–1.0)
Monocytes Relative: 7 %
Neutro Abs: 3.4 10*3/uL (ref 1.7–7.7)
Neutrophils Relative %: 50 %
Platelets: 260 10*3/uL (ref 150–400)
RBC: 3.98 MIL/uL (ref 3.87–5.11)
RDW: 12.4 % (ref 11.5–15.5)
WBC: 6.9 10*3/uL (ref 4.0–10.5)
nRBC: 0 % (ref 0.0–0.2)

## 2021-01-31 LAB — URINALYSIS, ROUTINE W REFLEX MICROSCOPIC
Bilirubin Urine: NEGATIVE
Glucose, UA: NEGATIVE mg/dL
Hgb urine dipstick: NEGATIVE
Ketones, ur: NEGATIVE mg/dL
Leukocytes,Ua: NEGATIVE
Nitrite: NEGATIVE
Protein, ur: NEGATIVE mg/dL
Specific Gravity, Urine: 1.029 (ref 1.005–1.030)
pH: 5 (ref 5.0–8.0)

## 2021-01-31 LAB — I-STAT BETA HCG BLOOD, ED (MC, WL, AP ONLY): I-stat hCG, quantitative: <5 m[IU]/mL (ref <5)

## 2021-01-31 MED ORDER — LACTATED RINGERS IV BOLUS
1000.0000 mL | Freq: Once | INTRAVENOUS | Status: AC
  Administered 2021-01-31: 1000 mL via INTRAVENOUS

## 2021-01-31 NOTE — ED Provider Notes (Signed)
[facility] EMERGENCY DEPARTMENT Provider Note  CSN: [id number] Arrival date & time: 01/31/21 1734    History Chief Complaint  Patient presents with   Abdominal Pain    Tina Campos is a 20 y.o. female reports 2 weeks of intermittent cramping abdominal pain starts in RUQ and radiates to RLQ and around to suprapubic areas. Comes and goes without particular provoking or relieving factors. She has been having BM. She thought it was gas pains so she didn't come in until pain was worse today. She denies any fever, no vomiting. No diarrhea or blood in stool. No dysuria.    Past Medical History:  Diagnosis Date   Body mass index, pediatric, greater than or equal to 95th percentile for age 42/06/2013   Eczema 04/04/2014   Medical history non-contributory    Obesity    Recurrent boils 04/04/2014   Unspecified constipation 03/28/2013    Past Surgical History:  Procedure Laterality Date   HERNIA REPAIR     UMBILICAL HERNIA REPAIR  2005    Family History  Problem Relation Age of Onset   Cancer Mother        cervical cancer   Diabetes Father    Cancer Maternal Aunt     Social History   Tobacco Use   Smoking status: Light Smoker    Packs/day: 1.00    Types: Cigarettes   Smokeless tobacco: Never  Vaping Use   Vaping Use: Former  Substance Use Topics   Alcohol use: No   Drug use: Not Currently    Types: Marijuana     Home Medications Prior to Admission medications   Not on File     Allergies    Patient has no known allergies.   Review of Systems   Review of Systems A comprehensive review of systems was completed and negative except as noted in HPI.    Physical Exam BP (!) 108/58   Pulse 65   Temp 98.8 F (37.1 C) (Oral)   Resp 16   LMP 01/21/2021   SpO2 100%   Physical Exam Vitals and nursing note reviewed.  Constitutional:      Appearance: Normal appearance.  HENT:     Head: Normocephalic and atraumatic.     Nose: Nose normal.     Mouth/Throat:      Mouth: Mucous membranes are moist.  Eyes:     Extraocular Movements: Extraocular movements intact.     Conjunctiva/sclera: Conjunctivae normal.  Cardiovascular:     Rate and Rhythm: Normal rate.  Pulmonary:     Effort: Pulmonary effort is normal.     Breath sounds: Normal breath sounds.  Abdominal:     General: Abdomen is flat.     Palpations: Abdomen is soft.     Tenderness: There is abdominal tenderness in the right upper quadrant and right lower quadrant. There is no guarding. Negative signs include Murphy's sign and McBurney's sign.  Musculoskeletal:        General: No swelling. Normal range of motion.     Cervical back: Neck supple.  Skin:    General: Skin is warm and dry.  Neurological:     General: No focal deficit present.     Mental Status: She is alert.  Psychiatric:        Mood and Affect: Mood normal.     ED Results / Procedures / Treatments   Labs (all labs ordered are listed, but only abnormal results are displayed) Labs Reviewed  CBC WITH  DIFFERENTIAL/PLATELET - Abnormal; Notable for the following components:      Result Value   Hemoglobin 11.7 (*)    HCT 35.8 (*)    All other components within normal limits  COMPREHENSIVE METABOLIC PANEL  LIPASE, BLOOD  URINALYSIS, ROUTINE W REFLEX MICROSCOPIC  I-STAT BETA HCG BLOOD, ED ([facility], [facility], [facility] ONLY)    EKG None  Radiology US Abdomen Limited  Result Date: 01/31/2021 CLINICAL DATA:  Right upper quadrant pain EXAM: ULTRASOUND ABDOMEN LIMITED RIGHT UPPER QUADRANT COMPARISON:  10/18/2019 FINDINGS: Gallbladder: No gallstones or wall thickening visualized. No sonographic Murphy sign noted by sonographer. Common bile duct: Diameter: 3 mm Liver: No focal lesion identified. Within normal limits in parenchymal echogenicity. Portal vein is patent on color Doppler imaging with normal direction of blood flow towards the liver. Other: None. IMPRESSION: 1. Unremarkable right upper quadrant ultrasound. Electronically Signed   By:  Sharlet Salina M.D.   On: 01/31/2021 20:30    Procedures Procedures  Medications Ordered in the ED Medications  lactated ringers bolus 1,000 mL (0 mLs Intravenous Stopped 01/31/21 2102)     MDM Rules/Calculators/A&P MDM  Patient with atypical abdominal pain. No peritoneal signs on exam. Pain seems to be most localized to RUQ on exam. Labs done in triage are unremarkable. No signs of infection or biliary obstruction. Will check [place] for signs of cholelithiasis which may be causing symptoms.  ED Course  I have reviewed the triage vital signs and the nursing notes.  Pertinent labs & imaging results that were available during my care of the patient were reviewed by me and considered in my medical decision making (see chart for details).  Clinical Course as of 01/31/21 2103  Wynelle Link Jan 31, 2021  2034 [place] is normal. Awaiting UA then will likely be able to go home.  [CS]  2101 Care of the patient signed out to Dr. Rubin Payor pending UA.  [CS]    Clinical Course User Index [CS] Pollyann Savoy, MD    Final Clinical Impression(s) / ED Diagnoses Final diagnoses:  None    Rx / DC Orders ED Discharge Orders     None        Pollyann Savoy, MD 01/31/21 2103

## 2021-01-31 NOTE — ED Triage Notes (Signed)
C/o R sided abd pain x 2 weeks that is now across lower abd.  Denies nausea, vomiting, diarrhea, and urinary complaints.

## 2021-01-31 NOTE — ED Notes (Signed)
Patient transported to Ultrasound 

## 2021-01-31 NOTE — ED Provider Notes (Signed)
  Physical Exam  BP (!) 108/58   Pulse 65   Temp 98.8 F (37.1 C) (Oral)   Resp 16   LMP 01/21/2021   SpO2 100%   Physical Exam  ED Course/Procedures   Clinical Course as of 01/31/21 2137  Sun Jan 31, 2021  2034 [place] is normal. Awaiting UA then will likely be able to go home.  [CS]  2101 Care of the patient signed out to Dr. Rubin Payor pending UA.  [CS]    Clinical Course User Index [CS] Pollyann Savoy, MD    Procedures  MDM  Received patient in signout.  Right side abdominal pain.  Comes and goes.  Has seen GI for it previously.  Ultrasound done and reassuring.  Urine reassuring.  Blood work reassuring.  Only mild tenderness.  Appears stable for discharge.  Will discharge home with outpatient follow-up.       Benjiman Core, MD 01/31/21 2137

## 2021-01-31 NOTE — ED Provider Notes (Signed)
Emergency Medicine Provider Triage Evaluation Note  Tina Campos , a 20 y.o. female  was evaluated in triage.  Pt complains of abdominal pain.  She states that she has had 2 weeks of pain across her lower abdomen.  She mostly feels it at night when she lays down and then right lower quadrant as she feels a sharp pain however today it happened during the day causing her to get checked.  Her last bowel movement was today.  She states she has had similar things before, had an endoscopy and no cause for symptoms was found.  She recently had her cycle which she says was normal for her.  No fevers.  Mild nausea without vomiting or diarrhea.  No abnormal discharge.  No frequency urgency.  Review of Systems  Positive: See above Negative: See above.   Physical Exam  BP 117/80   Pulse 78   Temp 99.1 F (37.3 C) (Oral)   Resp 15   LMP 01/21/2021   SpO2 100%  Gen:   Awake, no distress  Resp:  Normal effort  MSK:   Moves extremities without difficulty  Other:  Generally well-appearing.  Abdomen is minimally tender diffusely.  No guarding or rebound.  Medical Decision Making  Medically screening exam initiated at 5:58 PM.  Appropriate orders placed.  Tina L Marcucci was informed that the remainder of the evaluation will be completed by another provider, this initial triage assessment does not replace that evaluation, and the importance of remaining in the ED until their evaluation is complete.  Will order labs, UA.   Cristina Gong, PA-C 01/31/21 1758    Pollyann Savoy, MD 01/31/21 [phone]

## 2021-03-10 ENCOUNTER — Other Ambulatory Visit: Payer: Self-pay

## 2021-03-10 ENCOUNTER — Other Ambulatory Visit: Payer: Self-pay | Admitting: *Deleted

## 2021-03-10 DIAGNOSIS — Z124 Encounter for screening for malignant neoplasm of cervix: Secondary | ICD-10-CM

## 2021-03-10 NOTE — Progress Notes (Signed)
Patient: Tina Campos           Date of Birth: 27-Dec-2000           MRN: [id number] Visit Date: 03/10/2021 PCP: Clemencia Course, PA-C  Cervical Cancer Screening Do you smoke?: Yes Have you ever had or been told you have an allergy to latex products?: Yes Marital status: Single Date of last pap smear: Never Date of last menstrual period: 02/11/21 Number of pregnancies: 0 Number of births: 0 Have you ever had any of the following? Hysterectomy: No Tubal ligation (tubes tied): No Abnormal bleeding: Yes Abnormal pap smear: No Venereal warts: No A sex partner with venereal warts: No A high risk* sex partner: No  Cervical Exam  Abnormal Observations: Observed bump left external genitalia that per patient is from shaving. Told patient if bump does not resolve or becomes infected the follow-up with her PCP or OBGYN. Blood observed in os consistent with patients menstrual period. Patient discussed irregular menstrual cycles. Patient is not currently using birth control and sexually active. Told patient there is lots of options for birth control. Let patient know that the Health Department provides birth control free or reduced cost.  Recommendations: Per patient has never had a Pap smear completed. No Pap smear results are available in Epic. Let patient know if today's Pap smear is normal that her next Pap smear is due in 3 years. Informed patient that will call her within the next couple of weeks with Pap smear results. Patient verbalized understanding.     Patient's History Patient Active Problem List   Diagnosis Date Noted   Adjustment disorder with depressed mood 01/07/2015   Past Medical History:  Diagnosis Date   Body mass index, pediatric, greater than or equal to 95th percentile for age 21/06/2013   Eczema 04/04/2014   Medical history non-contributory    Obesity    Recurrent boils 04/04/2014   Unspecified constipation 03/28/2013    Family History  Problem Relation Age of  Onset   Cancer Mother        cervical cancer   Diabetes Father    Cancer Maternal Aunt     Social History   Occupational History   Not on file  Tobacco Use   Smoking status: Light Smoker    Packs/day: 1.00    Types: Cigarettes   Smokeless tobacco: Never  Vaping Use   Vaping Use: Former  Substance and Sexual Activity   Alcohol use: No   Drug use: Not Currently    Types: Marijuana   Sexual activity: Yes    Birth control/protection: None

## 2021-03-15 LAB — CYTOLOGY - PAP: Diagnosis: NEGATIVE

## 2021-03-16 ENCOUNTER — Telehealth: Payer: Self-pay

## 2021-03-16 NOTE — Telephone Encounter (Signed)
Patient informed negative Pap results, next pap due in 3 years. Patient verbalized understanding.  

## 2021-05-21 ENCOUNTER — Encounter (HOSPITAL_COMMUNITY): Payer: Self-pay | Admitting: Emergency Medicine

## 2021-05-21 ENCOUNTER — Emergency Department (HOSPITAL_COMMUNITY)
Admission: EM | Admit: 2021-05-21 | Discharge: 2021-05-21 | Disposition: A | Discharge status: Home / Self Care | Payer: BC Managed Care – PPO | Attending: Emergency Medicine | Admitting: Emergency Medicine

## 2021-05-21 ENCOUNTER — Emergency Department (HOSPITAL_COMMUNITY): Payer: BC Managed Care – PPO

## 2021-05-21 DIAGNOSIS — R0789 Other chest pain: Secondary | ICD-10-CM | POA: Insufficient documentation

## 2021-05-21 DIAGNOSIS — R079 Chest pain, unspecified: Secondary | ICD-10-CM | POA: Diagnosis present

## 2021-05-21 DIAGNOSIS — Z91040 Latex allergy status: Secondary | ICD-10-CM | POA: Insufficient documentation

## 2021-05-21 LAB — I-STAT BETA HCG BLOOD, ED (MC, WL, AP ONLY): I-stat hCG, quantitative: <5 m[IU]/mL (ref <5)

## 2021-05-21 LAB — BASIC METABOLIC PANEL
Anion gap: 8 (ref 5–15)
BUN: 11 mg/dL (ref 6–20)
CO2: 23 mmol/L (ref 22–32)
Calcium: 9.3 mg/dL (ref 8.9–10.3)
Chloride: 107 mmol/L (ref 98–111)
Creatinine, Ser: 0.95 mg/dL (ref 0.44–1.00)
GFR, Estimated: >60 mL/min (ref >60)
Glucose, Bld: 86 mg/dL (ref 70–99)
Potassium: 3.7 mmol/L (ref 3.5–5.1)
Sodium: 138 mmol/L (ref 135–145)

## 2021-05-21 LAB — CBC
HCT: 39.8 % (ref 36.0–46.0)
Hemoglobin: 12.5 g/dL (ref 12.0–15.0)
MCH: 28.3 pg (ref 26.0–34.0)
MCHC: 31.4 g/dL (ref 30.0–36.0)
MCV: 90.2 fL (ref 80.0–100.0)
Platelets: 290 10*3/uL (ref 150–400)
RBC: 4.41 MIL/uL (ref 3.87–5.11)
RDW: 12.8 % (ref 11.5–15.5)
WBC: 6.7 10*3/uL (ref 4.0–10.5)
nRBC: 0 % (ref 0.0–0.2)

## 2021-05-21 LAB — TROPONIN I (HIGH SENSITIVITY): Troponin I (High Sensitivity): 3 ng/L (ref <18)

## 2021-05-21 MED ORDER — NAPROXEN 375 MG PO TABS: 375.0000 mg | ORAL_TABLET | Freq: Two times a day (BID) | ORAL | 0 refills | Status: DC

## 2021-05-21 NOTE — Discharge Instructions (Signed)
Your work-up today was reassuring against a serious cause of your chest pain.  Most likely have a musculoskeletal chest pain.  I have sent naproxen into the pharmacy for you.  If you have any worsening symptoms or develop shortness of breath, lightheadedness, or palpitations along with your chest pain please return for evaluation.  Otherwise follow-up with your primary care provider. ?

## 2021-05-21 NOTE — ED Provider Triage Note (Signed)
Emergency Medicine Provider Triage Evaluation Note ? ?Tina Campos , a 21 y.o. female  was evaluated in triage.  Pt complains of centralized, left-sided chest pain for the last 4 days.  Patient states that the pain is worsened when she takes a deep breath in or laughs, describes it as a "pinch".  Patient denies any OTC medications to relieve pain.  Patient denies any history of blood clots, recent travel, hormonal usage.  Patient states that she does smoke.  Chest pain is nonradiating, worsened with exertion. ? ?Review of Systems  ?Positive: Chest pain, headache ?Negative: Fevers, nausea, vomiting, shortness of breath, lightheadedness, dizziness ? ?Physical Exam  ?BP 119/64 (BP Location: Right Arm)   Pulse 94   Temp 99.1 ?F (37.3 ?C) (Oral)   Resp 14   SpO2 100%  ?Gen:   Awake, no distress   ?Resp:  Normal effort  ?MSK:   Moves extremities without difficulty  ?Other:  Lungs clear to auscultation ? ?Medical Decision Making  ?Medically screening exam initiated at 2:51 PM.  Appropriate orders placed.  Tina Campos was informed that the remainder of the evaluation will be completed by another provider, this initial triage assessment does not replace that evaluation, and the importance of remaining in the ED until their evaluation is complete. ? ? ?  ?Al Decant, PA-C ?05/21/21 1452 ? ?

## 2021-05-21 NOTE — ED Provider Notes (Signed)
?[facility] ?Provider Note ? ? ?CSN: [id number] ?Arrival date & time: 05/21/21  1435 ? ?  ? ?History ? ?Chief Complaint  ?Patient presents with  ? Chest Pain  ? ? ?Tina Campos is a 21 y.o. female. ? ?21 year old female presents today for evaluation of 4-day duration of left-sided chest pain.  Patient denies associated shortness of breath, lightheadedness, palpitations, or diaphoresis.  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  She states her pain is worse with exertion including coughing, taking a deep breath, or certain movements.  Denies prior cardiac history.  Denies recent long travel, leg pain or swelling, taking birth control pills.  Denies history of clotting disorders. ? ?The history is provided by the patient. No language interpreter was used.  ? ?  ? ?Home Medications ?Prior to Admission medications   ?Not on File  ?   ? ?Allergies    ?Latex   ? ?Review of Systems   ?Review of Systems  ?Constitutional:  Negative for chills and fever.  ?Respiratory:  Negative for cough and shortness of breath.   ?Gastrointestinal:  Negative for abdominal pain, nausea and vomiting.  ?Neurological:  Negative for syncope and light-headedness.  ?All other systems reviewed and are negative. ? ?Physical Exam ?Updated Vital Signs ?BP 119/64 (BP Location: Right Arm)   Pulse 94   Temp 99.1 ?F (37.3 ?C) (Oral)   Resp 14   SpO2 100%  ?Physical Exam ?Vitals and nursing note reviewed.  ?Constitutional:   ?   General: She is not in acute distress. ?   Appearance: Normal appearance. She is not ill-appearing.  ?HENT:  ?   Head: Normocephalic and atraumatic.  ?   Nose: Nose normal.  ?Eyes:  ?   General: No scleral icterus. ?   Extraocular Movements: Extraocular movements intact.  ?   Conjunctiva/sclera: Conjunctivae normal.  ?Cardiovascular:  ?   Rate and Rhythm: Normal rate and regular rhythm.  ?   Pulses: Normal pulses.  ?   Heart sounds: Normal heart sounds.  ?Pulmonary:   ?   Effort: Pulmonary effort is normal. No respiratory distress.  ?   Breath sounds: Normal breath sounds. No wheezing or rales.  ?Musculoskeletal:     ?   General: Normal range of motion.  ?   Cervical back: Normal range of motion.  ?   Comments: Reproducible chest pain to left upper chest on palpation.  ?Skin: ?   General: Skin is warm and dry.  ?Neurological:  ?   General: No focal deficit present.  ?   Mental Status: She is alert. Mental status is at baseline.  ? ? ?ED Results / Procedures / Treatments   ?Labs ?(all labs ordered are listed, but only abnormal results are displayed) ?Labs Reviewed  ?BASIC METABOLIC PANEL  ?CBC  ?I-STAT BETA HCG BLOOD, ED ([facility], [facility], [facility] ONLY)  ?TROPONIN I (HIGH SENSITIVITY)  ?TROPONIN I (HIGH SENSITIVITY)  ? ? ?EKG ?EKG Interpretation ? ?Date/Time:  Friday May 21 2021 14:41:13 EDT ?Ventricular Rate:  92 ?PR Interval:  172 ?QRS Duration: 76 ?QT Interval:  344 ?QTC Calculation: 425 ?R Axis:   6 ?Text Interpretation: Normal sinus rhythm Possible Anterior infarct , age undetermined Abnormal ECG When compared with ECG of 16-Aug-2018 19:20, PREVIOUS ECG IS PRESENT No significant change since last tracing Confirmed by Isla Pence [id number]) on 05/21/2021 4:55:21 PM ? ?Radiology ?DG Chest 2 View ? ?Result Date: 05/21/2021 ?CLINICAL DATA:  Left-sided chest  pain EXAM: CHEST - 2 VIEW COMPARISON:  08/16/2018 FINDINGS: The heart size and mediastinal contours are within normal limits. Both lungs are clear. The visualized skeletal structures are unremarkable. IMPRESSION: No acute abnormality of the lungs. Electronically Signed   By: Delanna Ahmadi M.D.   On: 05/21/2021 15:05   ? ?Procedures ?Procedures  ? ? ?Medications Ordered in ED ?Medications - No data to display ? ?ED Course/ Medical Decision Making/ A&P ?  ?                        ?Medical Decision Making ?Amount and/or Complexity of Data Reviewed ?Labs: ordered. ?Radiology: ordered. ? ? ?Medical Decision Making / ED Course ? ? ?This patient  presents to the ED for concern of chest pain, this involves an extensive number of treatment options, and is a complaint that carries with it a high risk of complications and morbidity.  The differential diagnosis includes PE, pneumonia, pleural effusion, ACS, MSK pain ? ?MDM: ?21 year old female with no prior cardiac history or significant family history presents today for evaluation of left-sided chest pain of 4-day duration.  Without associated symptoms such as shortness of breath, lightheadedness, palpitations.  Denies recent long travel, leg pain or leg swelling, does not take birth control pills.  Low risk on Wells PE criteria.  PERC negative.  Heart score of 0.  Doubt ACS.  CBC without leukocytosis or anemia, BMP unremarkable.  Troponin negative.  Chest x-ray unremarkable.  Patient most likely has MSK pain given her occupation, work-up, reproducible nature on exam.  Will prescribe naproxen the patient.  Discussed importance of follow-up with PCP.  Return precautions discussed for concerning symptoms.  Patient voices understanding and is in agreement with plan. ? ? ?Lab Tests: ?-I ordered, reviewed, and interpreted labs.   ?The pertinent results include:   ?Labs Reviewed  ?BASIC METABOLIC PANEL  ?CBC  ?I-STAT BETA HCG BLOOD, ED ([facility], [facility], [facility] ONLY)  ?TROPONIN I (HIGH SENSITIVITY)  ?TROPONIN I (HIGH SENSITIVITY)  ?  ? ? ?EKG ? EKG Interpretation ? ?Date/Time:  Friday May 21 2021 14:41:13 EDT ?Ventricular Rate:  92 ?PR Interval:  172 ?QRS Duration: 76 ?QT Interval:  344 ?QTC Calculation: 425 ?R Axis:   6 ?Text Interpretation: Normal sinus rhythm Possible Anterior infarct , age undetermined Abnormal ECG When compared with ECG of 16-Aug-2018 19:20, PREVIOUS ECG IS PRESENT No significant change since last tracing Confirmed by Isla Pence [phone]) on 05/21/2021 4:55:21 PM ?  ? ?  ? ? ? ?Imaging Studies ordered: ?I ordered imaging studies including chest x-ray ?I independently visualized and interpreted imaging. ?I  agree with the radiologist interpretation ? ? ?Medicines ordered and prescription drug management: ?No orders of the defined types were placed in this encounter. ?  ?-I have reviewed the patients home medicines and have made adjustments as needed ? ?Reevaluation: ?After the interventions noted above, I reevaluated the patient and found that they have :stayed the same ? ?Co morbidities that complicate the patient evaluation ? ?Past Medical History:  ?Diagnosis Date  ? Body mass index, pediatric, greater than or equal to 95th percentile for age 14/06/2013  ? Eczema 04/04/2014  ? Medical history non-contributory   ? Obesity   ? Recurrent boils 04/04/2014  ? Unspecified constipation 03/28/2013  ?  ? ? ?Dispostion: ?Patient is appropriate for discharge.  Discharged in stable condition.  Return precautions discussed. ? ?Final Clinical Impression(s) / ED Diagnoses ?Final diagnoses:  ?Chest wall pain  ? ? ?  Rx / DC Orders ?ED Discharge Orders   ? ?      Ordered  ?  naproxen (NAPROSYN) 375 MG tablet  2 times daily       ? 05/21/21 1721  ? ?  ?  ? ?  ? ? ?  ?Evlyn Courier, PA-C ?05/21/21 1721 ? ?  ?Isla Pence, MD ?05/21/21 2155 ? ?

## 2021-05-21 NOTE — ED Triage Notes (Signed)
Patient complains of pinching sensation in her chest when she laughs or breaths deeply that started four days ago. Patient is alert, oriented, and in no apparent distress at this time. Denies nausea, denies dizziness. ?

## 2021-09-06 ENCOUNTER — Other Ambulatory Visit: Payer: Self-pay

## 2021-09-06 ENCOUNTER — Emergency Department (HOSPITAL_COMMUNITY)
Admission: EM | Admit: 2021-09-06 | Discharge: 2021-09-07 | Discharge status: Against Medical Advice | Payer: Medicaid Other | Attending: Emergency Medicine | Admitting: Emergency Medicine

## 2021-09-06 ENCOUNTER — Encounter (HOSPITAL_COMMUNITY): Payer: Self-pay | Admitting: *Deleted

## 2021-09-06 DIAGNOSIS — Y92410 Unspecified street and highway as the place of occurrence of the external cause: Secondary | ICD-10-CM | POA: Insufficient documentation

## 2021-09-06 DIAGNOSIS — S59911A Unspecified injury of right forearm, initial encounter: Secondary | ICD-10-CM | POA: Diagnosis present

## 2021-09-06 DIAGNOSIS — S50811A Abrasion of right forearm, initial encounter: Secondary | ICD-10-CM | POA: Diagnosis not present

## 2021-09-06 DIAGNOSIS — Z5321 Procedure and treatment not carried out due to patient leaving prior to being seen by health care provider: Secondary | ICD-10-CM | POA: Diagnosis not present

## 2021-09-06 DIAGNOSIS — R519 Headache, unspecified: Secondary | ICD-10-CM | POA: Insufficient documentation
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
## 2021-09-07 NOTE — ED Notes (Signed)
Patient called x2 for vitals rechck with no response and not visible in the lobby

## 2021-09-09 ENCOUNTER — Encounter (HOSPITAL_COMMUNITY): Payer: Self-pay

## 2021-09-09 ENCOUNTER — Ambulatory Visit (HOSPITAL_COMMUNITY)
Admission: EM | Admit: 2021-09-09 | Discharge: 2021-09-09 | Disposition: A | Discharge status: Home / Self Care | Payer: Self-pay | Attending: Emergency Medicine | Admitting: Emergency Medicine

## 2021-09-09 DIAGNOSIS — S161XXA Strain of muscle, fascia and tendon at neck level, initial encounter: Secondary | ICD-10-CM

## 2021-09-09 DIAGNOSIS — M7918 Myalgia, other site: Secondary | ICD-10-CM

## 2021-09-09 DIAGNOSIS — M545 Low back pain, unspecified: Secondary | ICD-10-CM

## 2021-09-09 MED ORDER — MUPIROCIN CALCIUM 2 % EX CREA: 1.0000 | TOPICAL_CREAM | Freq: Two times a day (BID) | CUTANEOUS | 0 refills | Status: DC
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
## 2021-09-09 NOTE — Discharge Instructions (Addendum)
You will be sore for several days, should be feeling better daily.. May take over the counter tylenol/ibuprofen as label directed for pain/discomfort. Apply mupirocin ointment and dressing daily. Follow up with PCP for recheck. Ice to sore areas 20 min 3 x daily.
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
## 2021-12-27 ENCOUNTER — Telehealth: Payer: No Typology Code available for payment source | Admitting: Physician Assistant

## 2021-12-27 DIAGNOSIS — Z3200 Encounter for pregnancy test, result unknown: Secondary | ICD-10-CM

## 2021-12-27 DIAGNOSIS — N926 Irregular menstruation, unspecified: Secondary | ICD-10-CM

## 2021-12-27 NOTE — Progress Notes (Signed)
Because of having abnormal menses and pain with possible pregnancy,  I feel your condition warrants further evaluation and I recommend that you be seen in a face to face visit with your gynecologist or at one of our [facility] clinics.   NOTE: There will be NO CHARGE for this eVisit   If you are having a true medical emergency please call 911.    *Center for [facility]             [address] [place], [place] [address] [phone] (*Take patients with no insurance)  *Center for [facility] at [place], [place],  [place]  [address] [phone] (*Take patients with no insurance)  Center for [facility] at [place], [place], [place], [place], [address] [phone]  [facility] at [place], [place], [place], [place], [address] [phone]  Center for [facility] at [facility] [address] [place], [place], [place], [address] [phone]  Center for [facility] at [facility]                                 [place], [place], [place], [address] [phone]  Center for [facility] at [facility]                                    [address] [place], [place], [address] [facility] [facility] [address], [place], [place], [place], [address]                              [facility] [place], [place], [place], [place], [address] [phone]  Your MyChart E-visit questionnaire answers were reviewed by a board certified advanced clinical practitioner to complete your personal care plan based on your specific symptoms.  Thank you for using e-Visits.   I have spent 5 minutes in review of e-visit questionnaire, review and updating patient chart,  medical decision making and response to patient.   Mar Daring, PA-C

## 2022-05-31 ENCOUNTER — Ambulatory Visit (INDEPENDENT_AMBULATORY_CARE_PROVIDER_SITE_OTHER): Payer: Medicaid Other | Admitting: General Practice

## 2022-05-31 ENCOUNTER — Other Ambulatory Visit: Payer: Self-pay

## 2022-05-31 ENCOUNTER — Other Ambulatory Visit (HOSPITAL_COMMUNITY)
Admission: RE | Admit: 2022-05-31 | Discharge: 2022-05-31 | Disposition: A | Discharge status: Home / Self Care | Payer: Medicaid Other | Source: Ambulatory Visit | Attending: Family Medicine | Admitting: Family Medicine

## 2022-05-31 VITALS — BP 128/61 | HR 67 | Ht 64.0 in | Wt 193.0 lb

## 2022-05-31 DIAGNOSIS — Z113 Encounter for screening for infections with a predominantly sexual mode of transmission: Secondary | ICD-10-CM

## 2022-05-31 DIAGNOSIS — Z9151 Personal history of suicidal behavior: Secondary | ICD-10-CM | POA: Insufficient documentation

## 2022-05-31 LAB — POCT PREGNANCY, URINE: Preg Test, Ur: NEGATIVE

## 2022-05-31 NOTE — Progress Notes (Signed)
Patient presents to office today reporting recent changes to her menstrual cycle, some cycles lasting longer, some shorter. She has also noticed a change in the amount of bleeding, sometimes only light bleeding/spotting. She is not using anything for birth control. Denies abnormal vaginal discharge, irritation or vaginal odor. Offered STD screening today and UPT to rule out those things as reasons for the change in her bleeding. Patient also reports pelvic pain, back pain, thigh pain, and generalized body pain around periods and after her cycles. UPT - today. Patient was instructed in self swab & specimen collected. Advised results will be back in 24-48 hours and available via mychart. She desires to quit smoking- discussed quit line number & utilizing friends/family for support as well as yoga which has been helpful for her. Patient was previously seeing a counselor but stopped going due to financial reasons- has interest in seeing someone. Discussed office [facility] services to get started- patient would like to schedule with Asher Muir. Patient will follow up with a provider on 5/22.   Chase Caller RN BSN 05/31/22

## 2022-06-01 LAB — CERVICOVAGINAL ANCILLARY ONLY
Chlamydia: NEGATIVE
Comment: NEGATIVE
Comment: NEGATIVE
Comment: NORMAL
Neisseria Gonorrhea: NEGATIVE
Trichomonas: NEGATIVE

## 2022-06-01 LAB — HEPATITIS C ANTIBODY: Hep C Virus Ab: NONREACTIVE

## 2022-06-01 LAB — HEPATITIS B SURFACE ANTIGEN: Hepatitis B Surface Ag: NEGATIVE

## 2022-06-01 LAB — HIV ANTIBODY (ROUTINE TESTING W REFLEX): HIV Screen 4th Generation wRfx: NONREACTIVE

## 2022-06-01 LAB — RPR: RPR Ser Ql: NONREACTIVE

## 2022-07-06 ENCOUNTER — Encounter (HOSPITAL_COMMUNITY): Payer: Self-pay | Admitting: Emergency Medicine

## 2022-07-06 ENCOUNTER — Other Ambulatory Visit: Payer: Self-pay

## 2022-07-06 ENCOUNTER — Ambulatory Visit (HOSPITAL_COMMUNITY)
Admission: EM | Admit: 2022-07-06 | Discharge: 2022-07-06 | Disposition: A | Discharge status: Home / Self Care | Payer: Medicaid Other | Attending: Emergency Medicine | Admitting: Emergency Medicine

## 2022-07-06 DIAGNOSIS — R432 Parageusia: Secondary | ICD-10-CM | POA: Insufficient documentation

## 2022-07-06 DIAGNOSIS — R109 Unspecified abdominal pain: Secondary | ICD-10-CM | POA: Insufficient documentation

## 2022-07-06 DIAGNOSIS — R519 Headache, unspecified: Secondary | ICD-10-CM | POA: Insufficient documentation

## 2022-07-06 LAB — POCT URINALYSIS DIP (MANUAL ENTRY)
Bilirubin, UA: NEGATIVE
Glucose, UA: NEGATIVE mg/dL
Ketones, POC UA: NEGATIVE mg/dL
Leukocytes, UA: NEGATIVE
Nitrite, UA: NEGATIVE
Protein Ur, POC: NEGATIVE mg/dL
Spec Grav, UA: 1.02 (ref 1.010–1.025)
Urobilinogen, UA: 0.2 E.U./dL
pH, UA: 8.5 — AB (ref 5.0–8.0)

## 2022-07-06 LAB — POCT URINE PREGNANCY: Preg Test, Ur: NEGATIVE

## 2022-07-06 MED ORDER — KETOROLAC TROMETHAMINE 30 MG/ML IJ SOLN
30.0000 mg | Freq: Once | INTRAMUSCULAR | Status: AC
  Administered 2022-07-06: 30 mg via INTRAMUSCULAR

## 2022-07-06 MED ORDER — KETOROLAC TROMETHAMINE 30 MG/ML IJ SOLN
INTRAMUSCULAR | Status: AC
  Filled 2022-07-06: qty 1

## 2022-07-06 NOTE — Discharge Instructions (Addendum)
We have given you an injection of Toradol today in clinic, this should help with your headache and pains.  I am sending your urine off for culture and our staff will reach out to you if antibiotics are indicated.  Your symptoms could be due to a viral infection, and this should pass within the next week.  Overall, I am not sure what is causing your various symptoms.  I suggest following up with GYN for further evaluation of your irregular menstrual cycles, and they can consider putting you on contraception to regulate your menstrual cycle.  I recommend establishing with a primary care provider who can check basic labs as well as vitamin D and B12 to evaluate your generalized weakness.  The Toradol should help with your headache, please ensure you are drinking at least 64 ounces of water daily, getting enough sleep, and managing her stressors to help prevent these in the future.  Following up with a primary care can further evaluate your headaches.  You can return to this clinic if you have any new or concerning symptoms.

## 2022-07-06 NOTE — ED Triage Notes (Signed)
Symptoms started yesterday.  Noticed food tasting odd.  Patient felt nauseated, no vomiting.  Last night had cramps, and thighs feel weak.  Patient has a headache.  Reports a month long history of headaches and it is the forehead that hurts.    Has not had any medications for symptoms

## 2022-07-06 NOTE — ED Notes (Signed)
Reviewed work note 
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
## 2022-07-06 NOTE — ED Triage Notes (Signed)
Reports "my body is doing weird stuff, my cycles are acting weird".  Last menstrual period was June 11, 2022.

## 2022-07-07 LAB — URINE CULTURE: Culture: NO GROWTH

## 2022-07-12 NOTE — Progress Notes (Unsigned)
ANNUAL EXAM Patient name: Tina Campos MRN [id number]  Date of birth: December 01, 2000 Chief Complaint:   No chief complaint on file.  History of Present Illness:   Tina Campos is a 22 y.o. No obstetric history on file. {race:25618} female being seen today for a routine annual exam.  Current complaints: ***  Patient's last menstrual period was 06/11/2022 (exact date).   The pregnancy intention screening data noted above was reviewed. Potential methods of contraception were discussed. The patient elected to proceed with No data recorded.   Last pap ***. Results were: {Pap findings:25134}. H/O abnormal pap: {yes/yes***/no:23866} Last mammogram: ***. Results were: {normal, abnormal, n/a:23837}. Family h/o breast cancer: {yes***/no:23838} Last colonoscopy: ***. Results were: {normal, abnormal, n/a:23837}. Family h/o colorectal cancer: {yes***/no:23838}     06/24/2020   11:40 AM  Depression screen PHQ 2/9  Decreased Interest 1  Down, Depressed, Hopeless 2  PHQ - 2 Score 3  Altered sleeping 2  Tired, decreased energy 1  Change in appetite 1  Feeling bad or failure about yourself  2  Trouble concentrating 1  Moving slowly or fidgety/restless 0  Suicidal thoughts 1  PHQ-9 Score 11        06/24/2020   11:40 AM  GAD 7 : Generalized Anxiety Score  Nervous, Anxious, on Edge 1  Control/stop worrying 1  Worry too much - different things 1  Trouble relaxing 1  Restless 0  Easily annoyed or irritable 1  Afraid - awful might happen 0  Total GAD 7 Score 5     Review of Systems:   Pertinent items are noted in HPI Denies any headaches, blurred vision, fatigue, shortness of breath, chest pain, abdominal pain, abnormal vaginal discharge/itching/odor/irritation, problems with periods, bowel movements, urination, or intercourse unless otherwise stated above. Pertinent History Reviewed:  Reviewed past medical,surgical, social and family history.  Reviewed problem list, medications and  allergies. Physical Assessment:  There were no vitals filed for this visit. There is no height or weight on file to calculate BMI.        Physical Examination:   General appearance - well appearing, and in no distress  Mental status - alert, oriented to person, place, and time  Psych:  She has a normal mood and affect  Skin - warm and dry, normal color, no suspicious lesions noted  Chest - effort normal, all lung fields clear to auscultation bilaterally  Heart - normal rate and regular rhythm  Neck:  midline trachea, no thyromegaly or nodules  Breasts - breasts appear normal, no suspicious masses, no skin or nipple changes or  axillary nodes  Abdomen - soft, nontender, nondistended, no masses or organomegaly  Pelvic - VULVA: normal appearing vulva with no masses, tenderness or lesions  VAGINA: normal appearing vagina with normal color and discharge, no lesions  CERVIX: normal appearing cervix without discharge or lesions, no CMT  Thin prep pap is {Desc; done/not:10129} *** HR HPV cotesting  UTERUS: uterus is felt to be normal size, shape, consistency and nontender   ADNEXA: No adnexal masses or tenderness noted.  Rectal - normal rectal, good sphincter tone, no masses felt. Hemoccult: ***  Extremities:  No swelling or varicosities noted  Chaperone present for exam  No results found for this or any previous visit (from the past 24 hour(s)).  Assessment & Plan:      There are no diagnoses linked to this encounter. Will follow up results of pap smear and manage accordingly. Mammogram scheduled Colon cancer screening  is up to date***Referral made to Gastroenterology for colonoscopy***discussed Cologuard vs Colonoscopy details and patient will decide and let us know her decision. Routine preventative health maintenance measures emphasized. Please refer to After Visit Summary for other counseling recommendations.       Mammogram: {Mammo f/u:25212::"@ 22yo"}, or sooner if  problems Colonoscopy: {TCS f/u:25213::"@ 22yo"}, or sooner if problems  No orders of the defined types were placed in this encounter.   Meds: No orders of the defined types were placed in this encounter.   Follow-up: No follow-ups on file.  Bernerd Limbo, CNM 07/12/2022 7:58 PM

## 2022-07-13 ENCOUNTER — Other Ambulatory Visit: Payer: Self-pay

## 2022-07-13 ENCOUNTER — Encounter: Payer: Self-pay | Admitting: Certified Nurse Midwife

## 2022-07-13 ENCOUNTER — Ambulatory Visit (INDEPENDENT_AMBULATORY_CARE_PROVIDER_SITE_OTHER): Payer: Medicaid Other | Admitting: Certified Nurse Midwife

## 2022-07-13 VITALS — BP 116/81 | HR 73 | Wt 195.6 lb

## 2022-07-13 DIAGNOSIS — E559 Vitamin D deficiency, unspecified: Secondary | ICD-10-CM | POA: Diagnosis not present

## 2022-07-13 DIAGNOSIS — F419 Anxiety disorder, unspecified: Secondary | ICD-10-CM

## 2022-07-13 DIAGNOSIS — Z01419 Encounter for gynecological examination (general) (routine) without abnormal findings: Secondary | ICD-10-CM

## 2022-07-13 DIAGNOSIS — Z716 Tobacco abuse counseling: Secondary | ICD-10-CM

## 2022-07-13 DIAGNOSIS — F4321 Adjustment disorder with depressed mood: Secondary | ICD-10-CM

## 2022-07-13 DIAGNOSIS — N946 Dysmenorrhea, unspecified: Secondary | ICD-10-CM

## 2022-07-13 DIAGNOSIS — Z9151 Personal history of suicidal behavior: Secondary | ICD-10-CM

## 2022-07-13 MED ORDER — NICOTINE 21 MG/24HR TD PT24: 21.0000 mg | MEDICATED_PATCH | Freq: Every day | TRANSDERMAL | 3 refills | Status: AC

## 2022-07-13 NOTE — Patient Instructions (Addendum)
[facility]il[facility]  Try Rescue Remedy spray when needing a vape or cigarette, WITH a fidget and deep breathing. Look up Highly Sensitive People who are empaths Try daily ashwaghanda, red raspberry leaf tea or capsule daily, 830mg  DHA daily Heating pad and Aleve for period pains. Stop consuming much dairy.

## 2022-07-14 DIAGNOSIS — E559 Vitamin D deficiency, unspecified: Secondary | ICD-10-CM | POA: Insufficient documentation

## 2022-07-14 LAB — VITAMIN D 25 HYDROXY (VIT D DEFICIENCY, FRACTURES): Vit D, 25-Hydroxy: 14 ng/mL — ABNORMAL LOW (ref 30.0–100.0)

## 2022-07-14 MED ORDER — VITAMIN D (ERGOCALCIFEROL) 1.25 MG (50000 UNIT) PO CAPS: 50000.0000 [IU] | ORAL_CAPSULE | ORAL | 0 refills | Status: AC

## 2022-07-14 MED ORDER — VITAMIN D 125 MCG (5000 UT) PO CAPS: 1.0000 | ORAL_CAPSULE | Freq: Every day | ORAL | 11 refills | Status: AC

## 2022-07-17 IMAGING — US US ABDOMEN LIMITED
1 series · 14 of 25 positions shown · non-contrast
Comparison: 10/18/2019

CLINICAL DATA: Right upper quadrant pain

EXAM:
ULTRASOUND ABDOMEN LIMITED RIGHT UPPER QUADRANT

[Series 1: us abdomen limited · 14 of 33 slices shown]
[im 1/33]
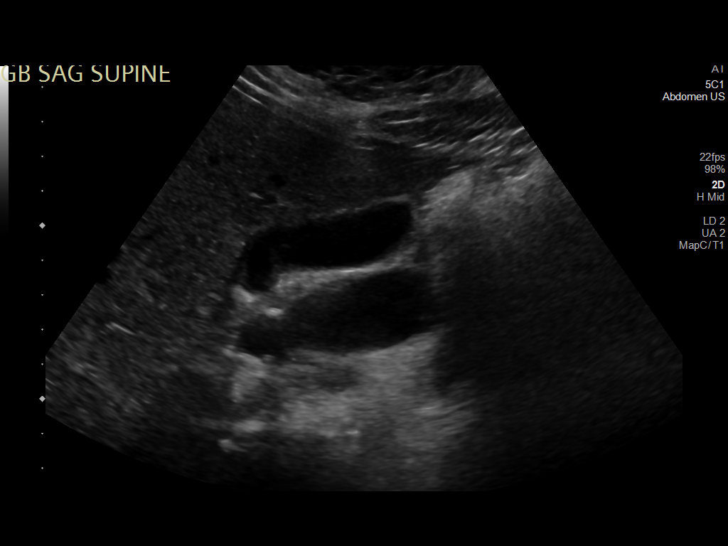
[im 3/33]
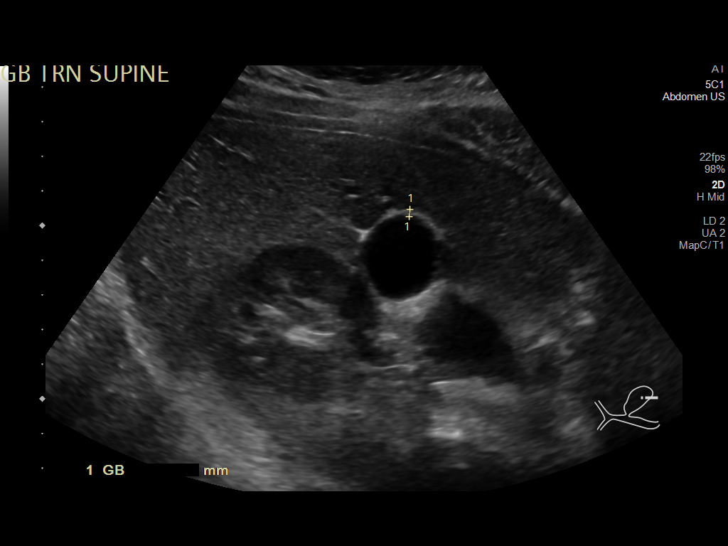
[im 6/33]
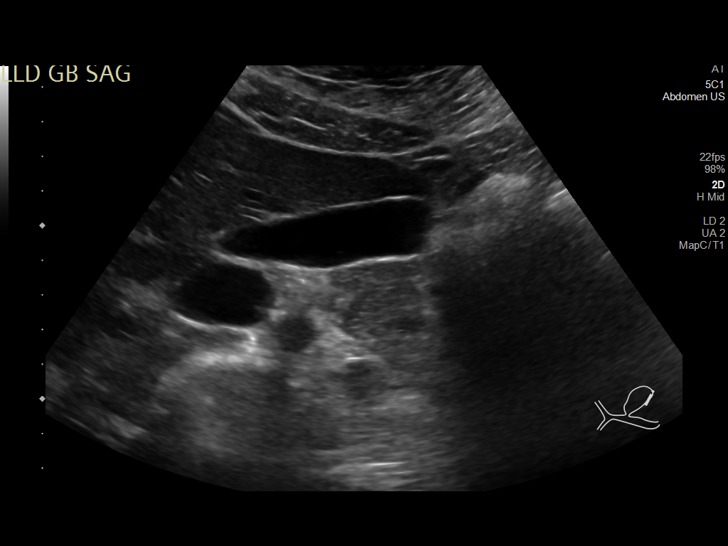
[im 9/33]
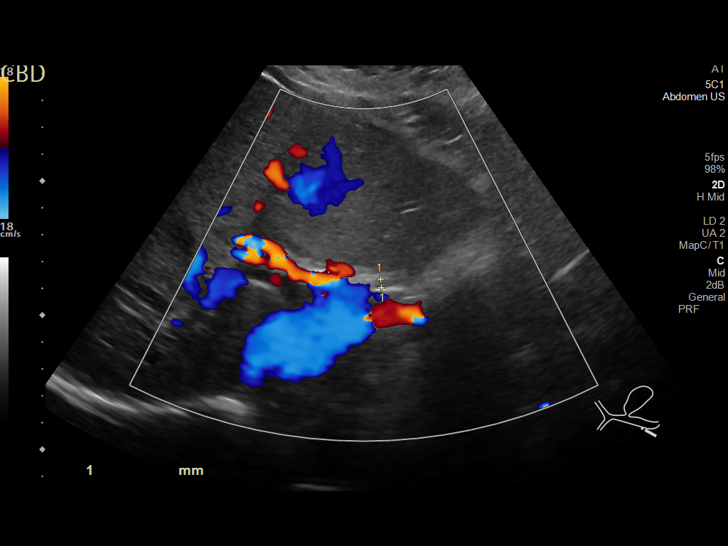
[im 11/33]
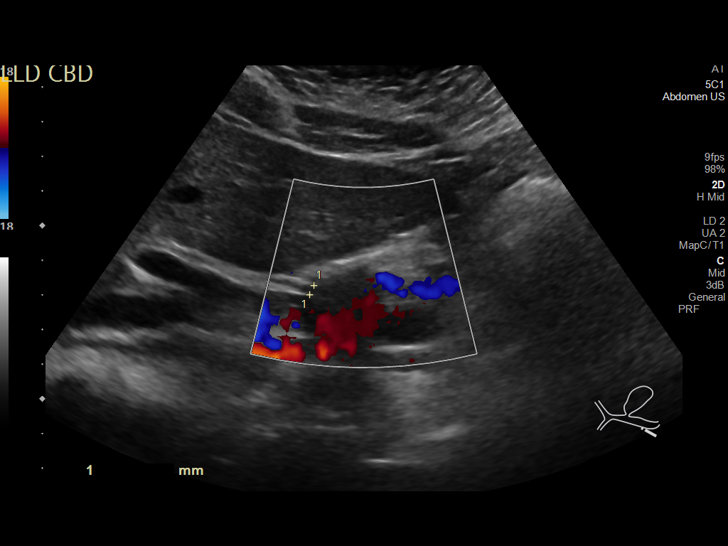
[im 13/33]
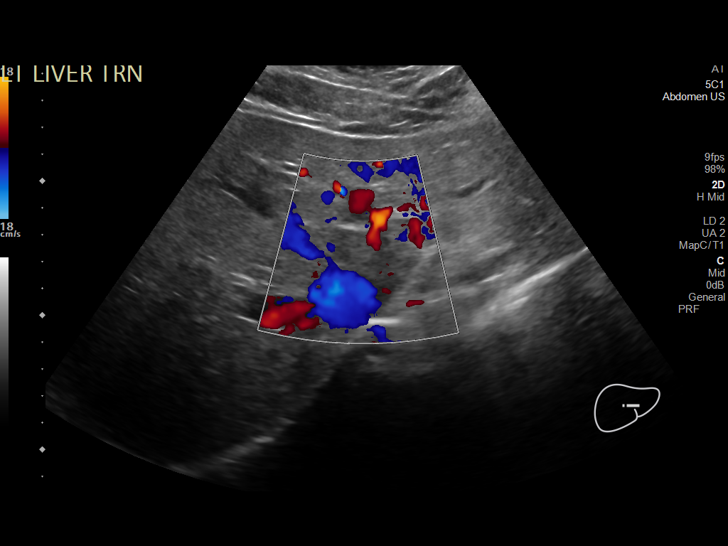
[im 15/33]
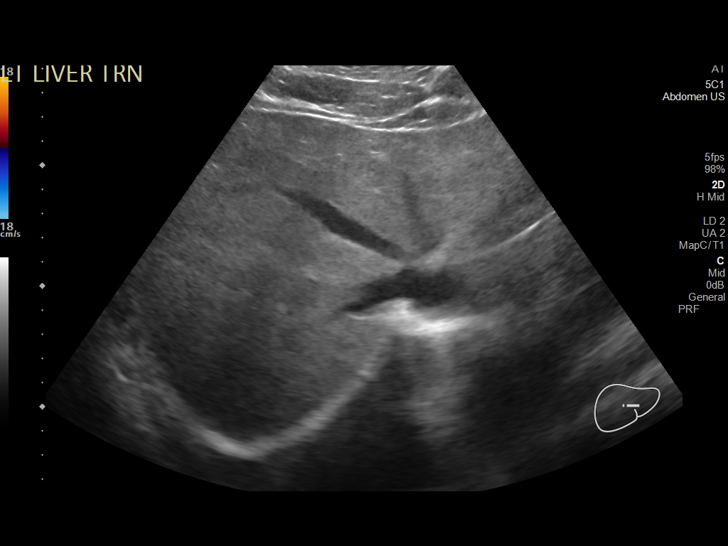
[im 18/33]
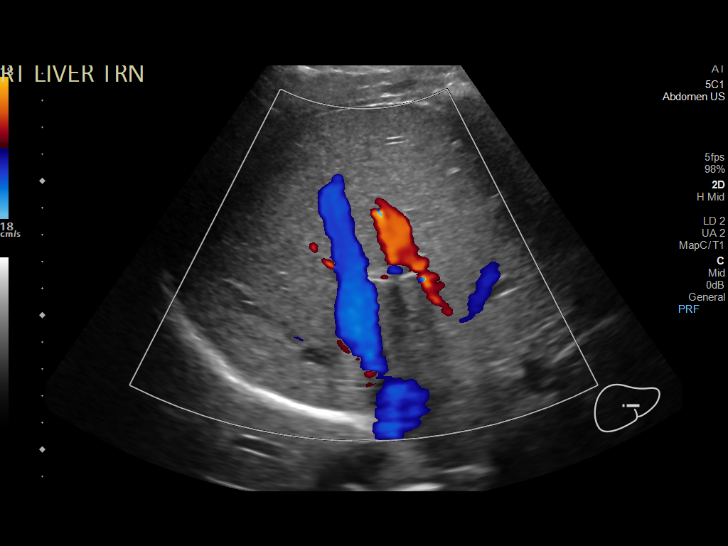
[im 21/33]
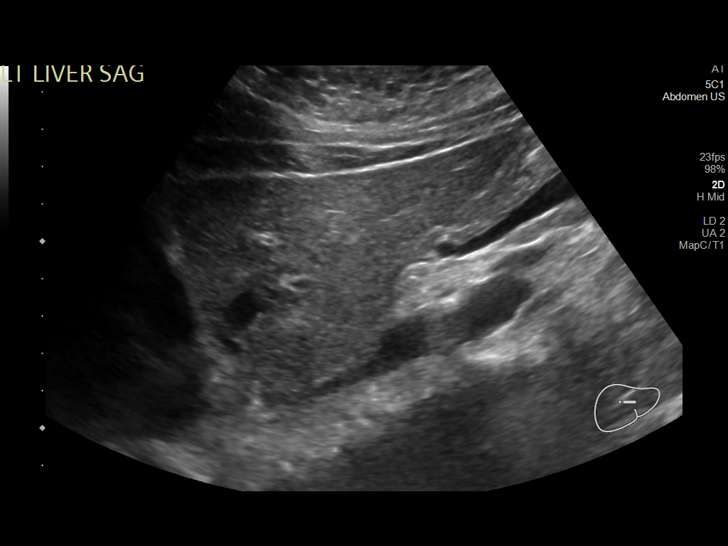
[im 22/33]
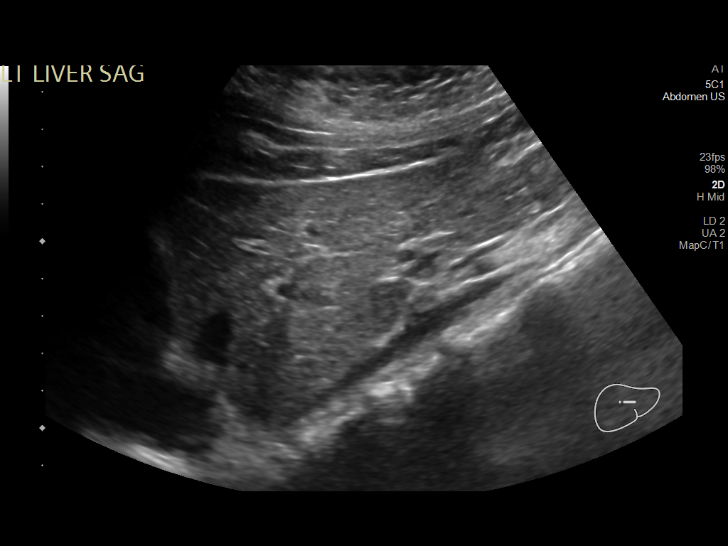
[im 25/33]
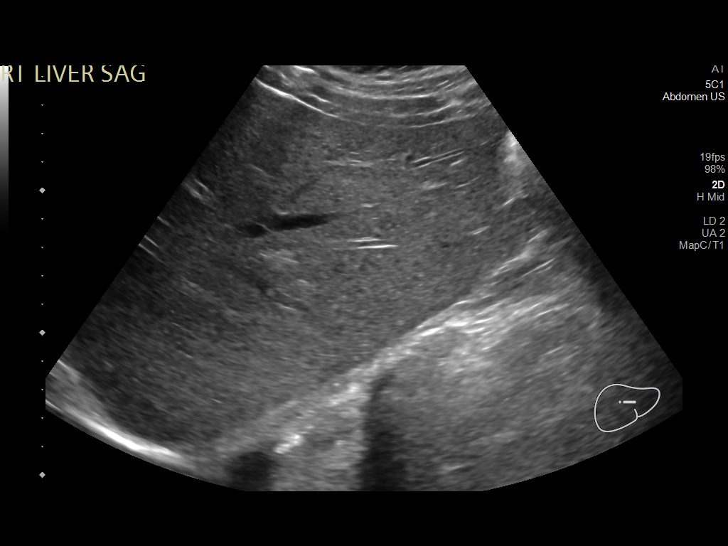
[im 27/33]
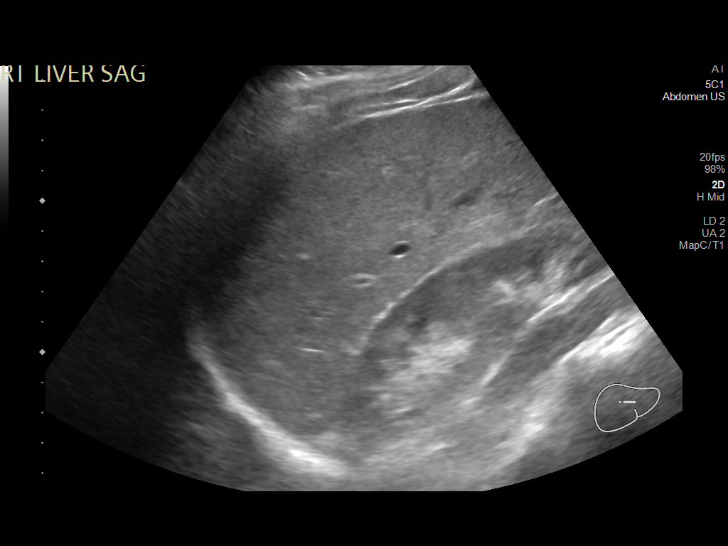
[im 30/33]
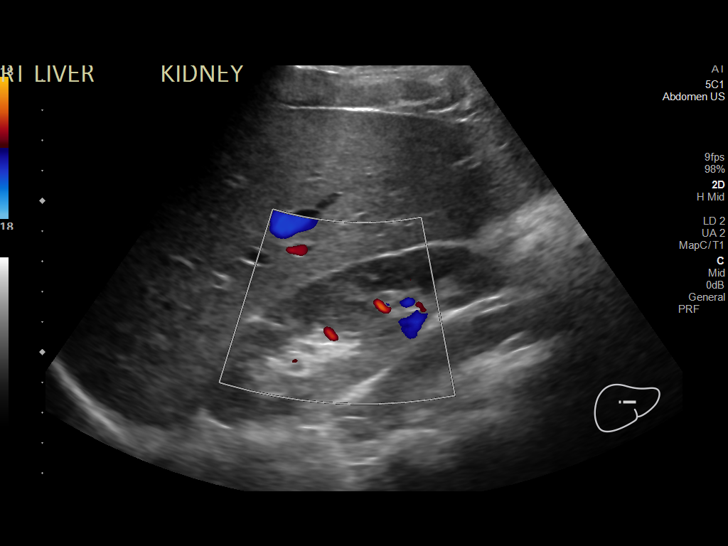
[im 33/33]
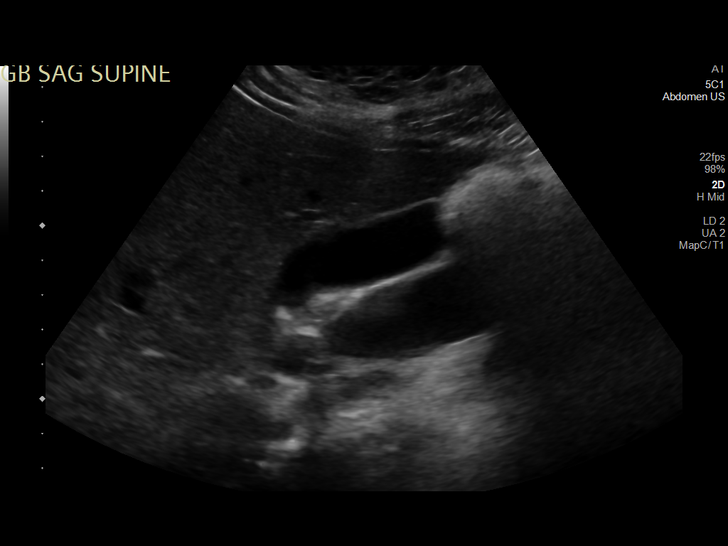

[14 of 25 positions shown; findings below may reference images not displayed]

FINDINGS: Gallbladder:

No gallstones or wall thickening visualized. No sonographic Murphy
sign noted by sonographer.

Common bile duct:

Diameter: 3 mm

Liver:

No focal lesion identified. Within normal limits in parenchymal
echogenicity. Portal vein is patent on color Doppler imaging with
normal direction of blood flow towards the liver.

Other: None.
IMPRESSION: 1. Unremarkable right upper quadrant ultrasound.

## 2022-07-29 NOTE — BH Specialist Note (Signed)
Integrated Behavioral Health via Telemedicine Visit  08/12/2022 Tina Campos L [place] [id number]  Number of Integrated Behavioral Health Clinician visits: 1- Initial Visit  Session Start time: [phone]   Session End time: 1029  Total time in minutes: 51   Referring Provider: Edd Arbour, CNM Patient/Family location: Home [facility] Provider location: [facility]  All persons participating in visit: Patient Tina Campos [place] and [facility] Tavien Chestnut   Types of Service: Individual psychotherapy and Video visit  I connected with Tina Campos Scherrie Gerlach and/or Tina Campos L Stephenson's  n/a  via  Telephone or Engineer, civil (consulting)  (Video is Surveyor, mining) and verified that I am speaking with the correct person using two identifiers. Discussed confidentiality: Yes   I discussed the limitations of telemedicine and the availability of in person appointments.  Discussed there is a possibility of technology failure and discussed alternative modes of communication if that failure occurs.  I discussed that engaging in this telemedicine visit, they consent to the provision of behavioral healthcare and the services will be billed under their insurance.  Patient and/or legal guardian expressed understanding and consented to Telemedicine visit: Yes   Presenting Concerns: Patient and/or family reports the following symptoms/concerns: Anxiety and depression, made worse in the heat, prior to and during menstruation and after less than 8 hours sleep and with life stress; currently copes with nicotine and sometimes alcohol; open to implementing self-coping strategies today. Pt endorses passive SI thoughts with no intent and no plan.  Duration of problem: Ongoing; Severity of problem:  moderately severe  Patient and/or Family's Strengths/Protective Factors: Concrete supports in place (healthy food, safe environments, etc.) and Sense of purpose  Goals  Addressed: Patient will:  Reduce symptoms of: agitation, anxiety, depression, and stress   Increase knowledge and/or ability of: self-management skills   Demonstrate ability to: Increase healthy adjustment to current life circumstances and Increase motivation to adhere to plan of care  Progress towards Goals: Ongoing  Interventions: Interventions utilized:  Mindfulness or Relaxation Training and CBT Cognitive Behavioral Therapy Standardized Assessments completed: Not Needed  Patient and/or Family Response: Patient agrees with treatment plan.   Assessment: Patient currently experiencing Mood disorder, unspecified; Psychosocial stress.   Patient may benefit from psychoeducation and brief therapeutic interventions regarding coping with symptoms of depression, anxiety, life stress  .  Plan: Follow up with behavioral health clinician on : Two weeks Behavioral recommendations:  -Continue taking Vitamin D as prescribed -CALM relaxation breathing exercise twice daily (morning; at bedtime with sleep sounds); as needed throughout the day. -Begin Worry Time strategy, as discussed. Start by setting up start and end time reminders on phone today; continue daily for two weeks. -Continue plan to obtain small fan for outings; fan to keep cool when sleeping Referral(s): Integrated [employer] (In Clinic)  I discussed the assessment and treatment plan with the patient and/or parent/guardian. They were provided an opportunity to ask questions and all were answered. They agreed with the plan and demonstrated an understanding of the instructions.   They were advised to call back or seek an in-person evaluation if the symptoms worsen or if the condition fails to improve as anticipated.  Rae Lips, LCSW     07/13/2022    8:53 AM 06/24/2020   11:40 AM  Depression screen PHQ 2/9  Decreased Interest 1 1  Down, Depressed, Hopeless 2 2  PHQ - 2 Score 3 3  Altered sleeping 2 2   Tired, decreased  energy 2 1  Change in appetite 2 1  Feeling bad or failure about yourself  2 2  Trouble concentrating 2 1  Moving slowly or fidgety/restless 1 0  Suicidal thoughts 1 1  PHQ-9 Score 15 11      07/13/2022    8:53 AM 06/24/2020   11:40 AM  GAD 7 : Generalized Anxiety Score  Nervous, Anxious, on Edge 2 1  Control/stop worrying 2 1  Worry too much - different things 2 1  Trouble relaxing 2 1  Restless 2 0  Easily annoyed or irritable 3 1  Afraid - awful might happen 1 0  Total GAD 7 Score 14 5

## 2022-08-08 ENCOUNTER — Telehealth: Payer: Medicaid Other | Admitting: Physician Assistant

## 2022-08-08 DIAGNOSIS — B3731 Acute candidiasis of vulva and vagina: Secondary | ICD-10-CM | POA: Diagnosis not present

## 2022-08-08 MED ORDER — FLUCONAZOLE 150 MG PO TABS: 150.0000 mg | ORAL_TABLET | Freq: Once | ORAL | 0 refills | Status: AC

## 2022-08-08 NOTE — Progress Notes (Signed)

## 2022-08-08 NOTE — Progress Notes (Signed)
I have spent 5 minutes in review of e-visit questionnaire, review and updating patient chart, medical decision making and response to patient.   Tyquez Hollibaugh Cody Kabao Leite, PA-C    

## 2022-08-12 ENCOUNTER — Ambulatory Visit (INDEPENDENT_AMBULATORY_CARE_PROVIDER_SITE_OTHER): Payer: Medicaid Other | Admitting: Clinical

## 2022-08-12 DIAGNOSIS — Z658 Other specified problems related to psychosocial circumstances: Secondary | ICD-10-CM

## 2022-08-12 DIAGNOSIS — F39 Unspecified mood [affective] disorder: Secondary | ICD-10-CM | POA: Diagnosis not present

## 2022-08-12 NOTE — Patient Instructions (Signed)
[facility] [address] [place], [place] [address] [phone] (main office) [phone] (Keerthi Hazell's office)    [facility]  [address], [place], [place] [address] [phone] or [phone] WALK-IN URGENT CARE 24/7 FOR ANYONE [address], [place], [place]  [phone] Fax: [phone] [email or web address] *Interpreters available *Accepts all insurance and uninsured for Urgent Care needs *Accepts Medicaid and uninsured for outpatient treatment (below)    ONLY FOR [place] Residents  Below:   Outpatient New Patient Assessment/Therapy Walk-ins:        Monday -Thursday 8am until slots are full.        Every Friday 1pm-4pm  (first come, first served)                   New Patient Psychiatry/Medication Management        Monday-Friday 8am-11am (first come, first served)              For all walk-ins we ask that you arrive by 7:15am, because patients will be seen in the order of arrival.   /Emotional Wellbeing Apps and Websites Here are a few free apps meant to help you to help yourself.  To find, try searching on the internet to see if the app is offered on Apple/Android devices. If your first choice doesn't come up on your device, the good news is that there are many choices! Play around with different apps to see which ones are helpful to you.    Calm This is an app meant to help increase calm feelings. Includes info, strategies, and tools for tracking your feelings.      Calm Harm  This app is meant to help with self-harm. Provides many 5-minute or 15-min coping strategies for doing instead of hurting yourself.       Healthy Minds Health Minds is a problem-solving tool to help deal with emotions and cope with stress you encounter wherever you are.      MindShift This app can help people cope with anxiety. Rather than trying to avoid anxiety, you can make an important shift and face  it.      MY3  MY3 features a support system, safety plan and resources with the goal of offering a tool to use in a time of need.       My Life My Voice  This mood journal offers a simple solution for tracking your thoughts, feelings and moods. Animated emoticons can help identify your mood.       Relax Melodies Designed to help with sleep, on this app you can mix sounds and meditations for relaxation.      Smiling Mind Smiling Mind is meditation made easy: it's a simple tool that helps put a smile on your mind.        Stop, Breathe & Think  A friendly, simple guide for people through meditations for mindfulness and compassion.  Stop, Breathe and Think Kids Enter your current feelings and choose a "mission" to help you cope. Offers videos for certain moods instead of just sound recordings.       Team Orange The goal of this tool is to help teens change how they think, act, and react. This app helps you focus on your own good feelings and experiences.      The Virtual Hope Box The Virtual Hope Box (VHB) contains simple tools to help patients with coping, relaxation, distraction, and positive thinking.      

## 2022-08-22 NOTE — BH Specialist Note (Signed)
Integrated Behavioral Health via Telemedicine Visit  7/12/2024968  Tina Campos [id number]  Number of Integrated Behavioral Health Clinician visits: 2- Second Visit  Session Start time: 1018   Session End time: 1052  Total time in minutes: 34   Referring Provider: Edd Arbour, CNM Patient/Family location: Home [facility] Provider location: [facility]  All persons participating in visit: Patient Tina Campos and [facility] [place] Fidalgo   Types of Service: Individual psychotherapy and Telephone visit  I connected with Tina Campos and/or Tina Campos's  n/a  via  Telephone or Engineer, civil (consulting)  (Video is Surveyor, mining) and verified that I am speaking with the correct person using two identifiers. Discussed confidentiality: Yes   I discussed the limitations of telemedicine and the availability of in person appointments.  Discussed there is a possibility of technology failure and discussed alternative modes of communication if that failure occurs.  I discussed that engaging in this telemedicine visit, they consent to the provision of behavioral healthcare and the services will be billed under their insurance.  Patient and/or legal guardian expressed understanding and consented to Telemedicine visit: Yes   Presenting Concerns: Patient and/or family reports the following symptoms/concerns: Worry, primarily regarding maintaining current job and finances; successful in quitting nicotine for 5 days; on lapse, on day 4 today and motivated to continue. Pt's future goals are to travel and paint and stop cursing before she has children. Pt open to exploring how to work towards those goals today.  Duration of problem: Ongoing; Severity of problem: moderate  Patient and/or Family's Strengths/Protective Factors: Concrete supports in place (healthy food, safe environments, etc.) and Sense of purpose  Goals  Addressed: Patient will:  Reduce symptoms of: agitation, anxiety, depression, and stress   Increase knowledge and/or ability of: healthy habits and self-management skills   Demonstrate ability to: Increase motivation to adhere to plan of care  Progress towards Goals: Ongoing  Interventions: Interventions utilized:  Motivational Interviewing and Supportive Reflection Standardized Assessments completed: GAD-7 and PHQ 9  Patient and/or Family Response: Patient agrees with treatment plan.   Assessment: Patient currently experiencing Mood disorder, unspecified; Psychosocial stress.   Patient may benefit from continued therapeutic intervention  .  Plan: Follow up with behavioral health clinician on : Two weeks Behavioral recommendations:  -Continue taking Vitamin D as prescribed -Continue using relaxation breathing exercises and yoga daily  -First step to painting goal: go to [employer] this week for painting supplies -First step to travel goal: find out exactly how much it costs to obtain passport -Next step to quit smoking goal: Figure out exactly how much you were spending on cigarettes weekly.  -Next step for both travel and quit smoking goals(and to consider for quit cursing goal): Begin putting money previously spent on cigarette pack (that's not being spent now) into passport savings. (Consider dollar amount to put into passport savings for each week you don't curse/cuss) Referral(s): Integrated [employer] (In Clinic)  I discussed the assessment and treatment plan with the patient and/or parent/guardian. They were provided an opportunity to ask questions and all were answered. They agreed with the plan and demonstrated an understanding of the instructions.   They were advised to call back or seek an in-person evaluation if the symptoms worsen or if the condition fails to improve as anticipated.  Rae Lips, LCSW     09/02/2022   10:29 AM 07/13/2022     8:53 AM  06/24/2020   11:40 AM  Depression screen PHQ 2/9  Decreased Interest 1 1 1   Down, Depressed, Hopeless 1 2 2   PHQ - 2 Score 2 3 3   Altered sleeping 0 2 2  Tired, decreased energy 3 2 1   Change in appetite 0 2 1  Feeling bad or failure about yourself  0 2 2  Trouble concentrating 3 2 1   Moving slowly or fidgety/restless 0 1 0  Suicidal thoughts 0 1 1  PHQ-9 Score 8 15 11       09/02/2022   10:32 AM 07/13/2022    8:53 AM 06/24/2020   11:40 AM  GAD 7 : Generalized Anxiety Score  Nervous, Anxious, on Edge 0 2 1  Control/stop worrying 3 2 1   Worry too much - different things 3 2 1   Trouble relaxing 2 2 1   Restless 0 2 0  Easily annoyed or irritable 2 3 1   Afraid - awful might happen 3 1 0  Total GAD 7 Score 13 14 5

## 2022-09-02 ENCOUNTER — Ambulatory Visit (INDEPENDENT_AMBULATORY_CARE_PROVIDER_SITE_OTHER): Payer: Medicaid Other | Admitting: Clinical

## 2022-09-02 DIAGNOSIS — F39 Unspecified mood [affective] disorder: Secondary | ICD-10-CM

## 2022-09-02 DIAGNOSIS — Z658 Other specified problems related to psychosocial circumstances: Secondary | ICD-10-CM

## 2022-09-02 NOTE — Patient Instructions (Signed)
[facility] [address] [place], [place] [address] [phone] (main office) [phone] (Mahlet Jergens's office)   

## 2022-09-05 NOTE — BH Specialist Note (Deleted)
Integrated Behavioral Health via Telemedicine Visit  09/05/2022 Tina L Sacks [id number]  Number of Integrated Behavioral Health Clinician visits: 2- Second Visit  Session Start time: 1018   Session End time: 1052  Total time in minutes: 34   Referring Provider: *** Patient/Family location: [facility] Provider location: *** All persons participating in visit: *** Types of Service: {CHL AMB TYPE OF SERVICE:[phone]}  I connected with Tina Campos and/or Tina Campos's {family members:20773} via  Telephone or Engineer, civil (consulting)  (Video is Surveyor, mining) and verified that I am speaking with the correct person using two identifiers. Discussed confidentiality: {YES/NO:21197}  I discussed the limitations of telemedicine and the availability of in person appointments.  Discussed there is a possibility of technology failure and discussed alternative modes of communication if that failure occurs.  I discussed that engaging in this telemedicine visit, they consent to the provision of behavioral healthcare and the services will be billed under their insurance.  Patient and/or legal guardian expressed understanding and consented to Telemedicine visit: {YES/NO:21197}  Presenting Concerns: Patient and/or family reports the following symptoms/concerns: *** Duration of problem: ***; Severity of problem: {Mild/Moderate/Severe:20260}  Patient and/or Family's Strengths/Protective Factors: {CHL AMB BH PROTECTIVE FACTORS:[phone]}  Goals Addressed: Patient will:  Reduce symptoms of: {IBH Symptoms:21014056}   Increase knowledge and/or ability of: {IBH Patient Tools:21014057}   Demonstrate ability to: {IBH Goals:21014053}  Progress towards Goals: {CHL AMB BH PROGRESS TOWARDS GOALS:[phone]}  Interventions: Interventions utilized:  {IBH Interventions:21014054} Standardized Assessments completed: {IBH Screening Tools:21014051}  Patient and/or Family  Response: ***  Assessment: Patient currently experiencing ***.   Patient may benefit from ***.  Plan: Follow up with behavioral health clinician on : *** Behavioral recommendations: *** Referral(s): {IBH Referrals:21014055}  I discussed the assessment and treatment plan with the patient and/or parent/guardian. They were provided an opportunity to ask questions and all were answered. They agreed with the plan and demonstrated an understanding of the instructions.   They were advised to call back or seek an in-person evaluation if the symptoms worsen or if the condition fails to improve as anticipated.  Valetta Close , LCSW

## 2022-09-20 NOTE — Progress Notes (Signed)
History:  Ms. Tina Campos is a 22 y.o. nulliparous female patient who presents to clinic today to assess how her dysmenorrhea and anxiety are progressing on the plan we created a month ago.  Pt reports 45 days without cigarettes, does occasionally still have a Black & Mild but not consistent smoking throughout the day. She has finished the course of weekly 50,000U of vitamin D and notes her period is not as painful and lighter than it had been. Also feels her anxiety is much better since taking the vitamin D (also her brother moved out).  The following portions of the patient's history were reviewed and updated as appropriate: allergies, current medications, family history, past medical history, social history, past surgical history and problem list.  Review of Systems:  Pertinent items noted in HPI and remainder of comprehensive ROS otherwise negative.   Objective:  Physical Exam BP 113/63   Pulse 68   Wt 198 lb 12.8 oz (90.2 kg)   LMP 09/16/2022 (Exact Date)   BMI 34.12 kg/m  Physical Exam Vitals and nursing note reviewed.  Constitutional:      Appearance: Normal appearance.  Cardiovascular:     Rate and Rhythm: Normal rate and regular rhythm.  Pulmonary:     Effort: Pulmonary effort is normal.  Musculoskeletal:        General: Normal range of motion.  Skin:    General: Skin is warm and dry.     Capillary Refill: Capillary refill takes less than 2 seconds.  Neurological:     Mental Status: She is alert and oriented to person, place, and time.  Psychiatric:        Mood and Affect: Mood normal.        Behavior: Behavior normal.    Labs and Imaging Vitamin D = 38.8 (much improved)   Assessment & Plan:  1. Dysmenorrhea - Much less painful and heavy  2. Anxiety - Progressing well with IBH visits, smoking cessation and better living situation  3. Encounter for smoking cessation counseling - Successfully stopped smoking cigarettes, continuing to ease off B&Ms as well -  Praised her efforts so far and encouraged her to keep working on her health  4. Vitamin D deficiency - Much improved, should continue lower daily dose - Vitamin D (25 hydroxy)  Follow up PRN or for annual exam.  Bernerd Limbo, CNM 09/23/2022 3:09 PM

## 2022-09-21 ENCOUNTER — Encounter: Payer: Self-pay | Admitting: Certified Nurse Midwife

## 2022-09-21 ENCOUNTER — Other Ambulatory Visit: Payer: Self-pay

## 2022-09-21 ENCOUNTER — Ambulatory Visit (INDEPENDENT_AMBULATORY_CARE_PROVIDER_SITE_OTHER): Payer: Medicaid Other | Admitting: Certified Nurse Midwife

## 2022-09-21 VITALS — BP 113/63 | HR 68 | Wt 198.8 lb

## 2022-09-21 DIAGNOSIS — F419 Anxiety disorder, unspecified: Secondary | ICD-10-CM

## 2022-09-21 DIAGNOSIS — N946 Dysmenorrhea, unspecified: Secondary | ICD-10-CM | POA: Diagnosis not present

## 2022-09-21 DIAGNOSIS — E559 Vitamin D deficiency, unspecified: Secondary | ICD-10-CM

## 2022-09-21 DIAGNOSIS — Z716 Tobacco abuse counseling: Secondary | ICD-10-CM | POA: Diagnosis not present

## 2023-03-14 ENCOUNTER — Other Ambulatory Visit: Payer: Self-pay | Admitting: Physician Assistant

## 2023-03-14 DIAGNOSIS — N920 Excessive and frequent menstruation with regular cycle: Secondary | ICD-10-CM

## 2023-03-14 DIAGNOSIS — N946 Dysmenorrhea, unspecified: Secondary | ICD-10-CM

## 2023-03-15 ENCOUNTER — Other Ambulatory Visit: Payer: Medicaid Other

## 2023-03-15 ENCOUNTER — Ambulatory Visit
Admission: RE | Admit: 2023-03-15 | Discharge: 2023-03-15 | Disposition: A | Discharge status: Home / Self Care | Payer: BC Managed Care – PPO | Source: Ambulatory Visit | Attending: Physician Assistant | Admitting: Physician Assistant

## 2023-03-15 DIAGNOSIS — N946 Dysmenorrhea, unspecified: Secondary | ICD-10-CM

## 2023-03-15 DIAGNOSIS — N920 Excessive and frequent menstruation with regular cycle: Secondary | ICD-10-CM

## 2023-05-23 ENCOUNTER — Encounter (HOSPITAL_COMMUNITY): Payer: Self-pay

## 2023-05-23 ENCOUNTER — Other Ambulatory Visit: Payer: Self-pay

## 2023-05-23 ENCOUNTER — Emergency Department (HOSPITAL_COMMUNITY)
Admission: EM | Admit: 2023-05-23 | Discharge: 2023-05-24 | Disposition: A | Discharge status: Home / Self Care | Attending: Emergency Medicine | Admitting: Emergency Medicine

## 2023-05-23 DIAGNOSIS — R519 Headache, unspecified: Secondary | ICD-10-CM | POA: Diagnosis present

## 2023-05-23 DIAGNOSIS — Z91040 Latex allergy status: Secondary | ICD-10-CM | POA: Diagnosis not present

## 2023-05-23 NOTE — ED Triage Notes (Signed)
 Pt reports headache and nausea, onset tonight around 2100 when she was at working. Hx of headaches but states this feels worse in the fact she is unable to continue to work at her job due to the pain. Denies vomiting. She reports light sensitivity. A&Ox4. Pain unrelieved with BC Powder.

## 2023-05-24 ENCOUNTER — Emergency Department (HOSPITAL_COMMUNITY)

## 2023-05-24 DIAGNOSIS — R519 Headache, unspecified: Secondary | ICD-10-CM | POA: Diagnosis not present

## 2023-05-24 LAB — RESP PANEL BY RT-PCR (RSV, FLU A&B, COVID)  RVPGX2
Influenza A by PCR: NEGATIVE
Influenza B by PCR: NEGATIVE
Resp Syncytial Virus by PCR: NEGATIVE
SARS Coronavirus 2 by RT PCR: NEGATIVE

## 2023-05-24 LAB — PREGNANCY, URINE: Preg Test, Ur: NEGATIVE

## 2023-05-24 MED ORDER — METOCLOPRAMIDE HCL 5 MG/ML IJ SOLN
10.0000 mg | Freq: Once | INTRAMUSCULAR | Status: AC
  Administered 2023-05-24: 10 mg via INTRAVENOUS
  Filled 2023-05-24: qty 2

## 2023-05-24 MED ORDER — DIPHENHYDRAMINE HCL 50 MG/ML IJ SOLN
12.5000 mg | Freq: Once | INTRAMUSCULAR | Status: AC
  Administered 2023-05-24: 12.5 mg via INTRAVENOUS
  Filled 2023-05-24: qty 1

## 2023-05-24 MED ORDER — KETOROLAC TROMETHAMINE 30 MG/ML IJ SOLN
15.0000 mg | Freq: Once | INTRAMUSCULAR | Status: AC
  Administered 2023-05-24: 15 mg via INTRAVENOUS
  Filled 2023-05-24: qty 1

## 2023-05-24 MED ORDER — MAGNESIUM SULFATE 2 GM/50ML IV SOLN
2.0000 g | Freq: Once | INTRAVENOUS | Status: AC
  Administered 2023-05-24: 2 g via INTRAVENOUS
  Filled 2023-05-24: qty 50

## 2023-05-24 NOTE — ED Provider Notes (Signed)
 [facility] EM[facility]PART[facility] AT [facility] Provider Note   CSN: [id number] Arrival date & time: 05/23/23  2309     History  Chief Complaint  Patient presents with   Headache    Tina Campos is a 23 y.o. female.  The history is provided by the patient and a significant other.  Headache Pain location:  Frontal Quality:  Dull Radiates to:  Does not radiate Onset quality:  Gradual Duration: hours. Progression:  Worsening Chronicity:  Recurrent Similar to prior headaches: yes   Context: not loud noise and not straining   Relieved by:  Nothing Worsened by:  Nothing Ineffective treatments: energy drink, goody powder. Associated symptoms: no back pain, no blurred vision, no eye pain, no facial pain, no fever, no neck pain, no neck stiffness, no numbness, no swollen glands, no syncope and no tingling   Patient with Biweekly headaches who started her menstrual cycle 3/27 presenting with frontal headache.  No changes in vision or speech or cognition.       Home Medications Prior to Admission medications   Medication Sig Start Date End Date Taking? Authorizing Provider  Cholecalciferol (VITAMIN D) 125 MCG (5000 UT) CAPS Take 1 capsule by mouth daily after breakfast. 07/14/22   Bernerd Limbo, CNM  nicotine (NICODERM CQ - DOSED IN MG/24 HOURS) 21 mg/24hr patch Place 1 patch (21 mg total) onto the skin daily. Patient not taking: Reported on 09/21/2022 07/13/22   Bernerd Limbo, CNM  Vitamin D, Ergocalciferol, (DRISDOL) 1.25 MG (50000 UNIT) CAPS capsule Take 1 capsule (50,000 Units total) by mouth every 7 (seven) days. Weekly dose for 6 weeks then begin taking 5000IU per day. 07/14/22   Bernerd Limbo, CNM      Allergies    Latex    Review of Systems   Review of Systems  Constitutional:  Negative for fever.  Eyes:  Negative for blurred vision and pain.  Cardiovascular:  Negative for syncope.  Musculoskeletal:  Negative for back pain, neck pain and neck  stiffness.  Neurological:  Positive for headaches. Negative for numbness.  All other systems reviewed and are negative.   Physical Exam Updated Vital Signs BP 130/78 (BP Location: Right Arm)   Pulse 67   Temp 98.6 F (37 C)   Resp 16   Ht 5\' 4"  (1.626 m)   Wt 81.6 kg   LMP 05/18/2023 (Approximate)   SpO2 100%   BMI 30.90 kg/m  Physical Exam Vitals and nursing note reviewed.  Constitutional:      General: She is not in acute distress.    Appearance: Normal appearance. She is well-developed.  HENT:     Head: Normocephalic and atraumatic.     Nose: Nose normal.     Mouth/Throat:     Pharynx: Oropharynx is clear.  Eyes:     Extraocular Movements: Extraocular movements intact.     Pupils: Pupils are equal, round, and reactive to light.     Comments: No proptosis intact cognition   Cardiovascular:     Rate and Rhythm: Normal rate and regular rhythm.     Pulses: Normal pulses.     Heart sounds: Normal heart sounds.  Pulmonary:     Effort: Pulmonary effort is normal. No respiratory distress.     Breath sounds: Normal breath sounds.  Abdominal:     General: Bowel sounds are normal. There is no distension.     Palpations: Abdomen is soft.     Tenderness: There is  no abdominal tenderness. There is no guarding or rebound.  Genitourinary:    Vagina: No vaginal discharge.  Musculoskeletal:        General: Normal range of motion.     Cervical back: Neck supple.  Skin:    General: Skin is warm and dry.     Capillary Refill: Capillary refill takes less than 2 seconds.     Findings: No erythema or rash.  Neurological:     General: No focal deficit present.     Mental Status: She is alert and oriented to person, place, and time.     Deep Tendon Reflexes: Reflexes normal.  Psychiatric:        Mood and Affect: Mood normal.     ED Results / Procedures / Treatments   Labs (all labs ordered are listed, but only abnormal results are displayed) Results for orders placed or  performed during the hospital encounter of 05/23/23  Resp panel by RT-PCR (RSV, Flu A&B, Covid) Anterior Nasal Swab   Collection Time: 05/24/23  1:35 AM   Specimen: Anterior Nasal Swab  Result Value Ref Range   SARS Coronavirus 2 by RT PCR NEGATIVE NEGATIVE   Influenza A by PCR NEGATIVE NEGATIVE   Influenza B by PCR NEGATIVE NEGATIVE   Resp Syncytial Virus by PCR NEGATIVE NEGATIVE  Pregnancy, urine   Collection Time: 05/24/23  3:22 AM  Result Value Ref Range   Preg Test, Ur NEGATIVE NEGATIVE   CT Head Wo Contrast Result Date: 05/24/2023 CLINICAL DATA:  23 year old female with headache, nausea since 20/1 100 hours. Photophobia. EXAM: CT HEAD WITHOUT CONTRAST TECHNIQUE: Contiguous axial images were obtained from the base of the skull through the vertex without intravenous contrast. RADIATION DOSE REDUCTION: This exam was performed according to the departmental dose-optimization program which includes automated exposure control, adjustment of the mA and/or kV according to patient size and/or use of iterative reconstruction technique. COMPARISON:  None Available. FINDINGS: Brain: Cerebral volume is within normal limits. No midline shift, ventriculomegaly, mass effect, evidence of mass lesion, intracranial hemorrhage or evidence of cortically based acute infarction. Gray-white matter differentiation is within normal limits throughout the brain. Normal basilar cisterns. Vascular: No suspicious intracranial vascular hyperdensity. Skull: Intact, negative. Sinuses/Orbits: Visualized paranasal sinuses and mastoids are clear. Other: Visualized orbits and scalp soft tissues are within normal limits. IMPRESSION: Normal noncontrast Head CT. Electronically Signed   By: Odessa Fleming M.D.   On: 05/24/2023 04:24     Radiology No results found.  Procedures Procedures    Medications Ordered in ED Medications  ketorolac (TORADOL) 30 MG/ML injection 15 mg (has no administration in time range)  magnesium sulfate  IVPB 2 g 50 mL (0 g Intravenous Stopped 05/24/23 0323)  diphenhydrAMINE (BENADRYL) injection 12.5 mg (12.5 mg Intravenous Given 05/24/23 0216)  metoCLOPramide (REGLAN) injection 10 mg (10 mg Intravenous Given 05/24/23 0216)    ED Course/ Medical Decision Making/ A&P                                 Medical Decision Making Patient with frontal headache at work   Amount and/or Complexity of Data Reviewed External Data Reviewed: notes.    Details: Previous notes  Labs: ordered.    Details: Negative covid and flu, negative pregnancy  Radiology: ordered and independent interpretation performed.    Details: Negative head CT  Risk Prescription drug management. Risk Details: Well appearing, symptoms resolved post medication.  Normal exam vitals and imaging.  Will refer to neurology.     Final Clinical Impression(s) / ED Diagnoses Final diagnoses:  Headache disorder   No signs of systemic illness or infection. The patient is nontoxic-appearing on exam and vital signs are within normal limits.  I have reviewed the triage vital signs and the nursing notes. Pertinent labs & imaging results that were available during my care of the patient were reviewed by me and considered in my medical decision making (see chart for details). After history, exam, and medical workup I feel the patient has been appropriately medically screened and is safe for discharge home. Pertinent diagnoses were discussed with the patient. Patient was given return precautions.  Rx / DC Orders ED Discharge Orders     None         Alahia Whicker, MD 05/24/23 [phone]

## 2023-05-24 NOTE — ED Notes (Signed)
 Patient transported to CT

## 2023-05-24 NOTE — ED Notes (Signed)
 AVS provided by edp was reviewed with pt. Pt verbalized understanding with no additional questions at this time. Pt to go home with mother at bedside

## 2023-05-24 NOTE — ED Notes (Signed)
 Pt In ct, will get vitals when pt returns

## 2023-06-07 ENCOUNTER — Ambulatory Visit: Admitting: Certified Nurse Midwife

## 2023-11-27 ENCOUNTER — Ambulatory Visit (HOSPITAL_COMMUNITY)
Admission: EM | Admit: 2023-11-27 | Discharge: 2023-11-27 | Disposition: A | Discharge status: Home / Self Care | Attending: Internal Medicine | Admitting: Internal Medicine

## 2023-11-27 ENCOUNTER — Encounter (HOSPITAL_COMMUNITY): Payer: Self-pay | Admitting: Emergency Medicine

## 2023-11-27 DIAGNOSIS — T3695XA Adverse effect of unspecified systemic antibiotic, initial encounter: Secondary | ICD-10-CM | POA: Insufficient documentation

## 2023-11-27 DIAGNOSIS — R350 Frequency of micturition: Secondary | ICD-10-CM | POA: Insufficient documentation

## 2023-11-27 DIAGNOSIS — B379 Candidiasis, unspecified: Secondary | ICD-10-CM | POA: Insufficient documentation

## 2023-11-27 LAB — POCT URINALYSIS DIP (MANUAL ENTRY)
Bilirubin, UA: NEGATIVE
Glucose, UA: NEGATIVE mg/dL
Ketones, POC UA: NEGATIVE mg/dL
Leukocytes, UA: NEGATIVE
Nitrite, UA: NEGATIVE
Protein Ur, POC: NEGATIVE mg/dL
Spec Grav, UA: 1.025 (ref 1.010–1.025)
Urobilinogen, UA: 0.2 U/dL
pH, UA: 6 (ref 5.0–8.0)

## 2023-11-27 MED ORDER — FLUCONAZOLE 150 MG PO TABS: 150.0000 mg | ORAL_TABLET | ORAL | 0 refills | Status: AC

## 2023-11-27 NOTE — Discharge Instructions (Signed)
 Your urine is unremarkable for signs of UTI.  I suspect that you have a vaginal yeast infection due to taking the amoxicillin for UTI.  I have sent in Diflucan  for you to take as prescribed 1 pill today and then 1 pill in 3 days.  Continue to avoid drinking urinary irritants such as sodas, caffeine, and other dehydrating beverages.  Continue to follow-up with your primary care provider for ongoing medical concerns.

## 2023-11-27 NOTE — ED Provider Notes (Signed)
 [facility]    CSN: [id number] Arrival date & time: 11/27/23  1152      History   Chief Complaint Chief Complaint  Patient presents with   Urinary Track infection    HPI Tina Campos is a 23 y.o. female.   Tina Campos is a 23 y.o. female presenting for chief complaint of urinary frequency that started approximately 1 week ago.  She was seen by her PCP who placed her on amoxicillin for probable urinary tract infection.  Patient developed a rash with amoxicillin after 4 days of taking in the antibiotic.  She states her urinary frequency has improved since she stopped drinking energy drinks and beverages other than water.  She admits to drinking multiple coffee/caffeinated drinks per day and this makes her urinary frequency worse.  She has been drinking lots of water for the last 24 to 48 hours and her symptoms have almost fully resolved/improved.  She would like to be checked for UTI today. She is currently on her menstrual cycle (started yesterday).  Denies chance of pregnancy, she has not been sexually active in the last 11 months. Denies concern for STD.  She reports vaginal itching without vaginal odor or vaginal discharge.  She read on the Internet that she could have a vaginal yeast infection due to the antibiotic use and would like to be tested for this today.  She has not attempted treatment of symptoms at home.  Denies nausea, vomiting, abdominal pain, dizziness, fever/chills, dysuria, urinary hesitancy, gross hematuria, flank pain, low back pain, and headaches.     Past Medical History:  Diagnosis Date   Body mass index, pediatric, greater than or equal to 95th percentile for age 34/06/2013   Eczema 04/04/2014   Medical history non-contributory    Obesity    Recurrent boils 04/04/2014   Unspecified constipation 03/28/2013    Patient Active Problem List   Diagnosis Date Noted   Vitamin D  deficiency 07/14/2022   History of suicide attempt 05/31/2022    Adjustment disorder with depressed mood 01/07/2015    Past Surgical History:  Procedure Laterality Date   HERNIA REPAIR     UMBILICAL HERNIA REPAIR  2005    OB History   No obstetric history on file.      Home Medications    Prior to Admission medications   Medication Sig Start Date End Date Taking? Authorizing Provider  fluconazole  (DIFLUCAN ) 150 MG tablet Take 1 tablet (150 mg total) by mouth every 3 (three) days. 11/27/23  Yes Enedelia Dorna HERO, FNP  Cholecalciferol (VITAMIN D ) 125 MCG (5000 UT) CAPS Take 1 capsule by mouth daily after breakfast. Patient not taking: Reported on 11/27/2023 07/14/22   Vannie Cornell SAUNDERS, CNM  nicotine  (NICODERM CQ  - DOSED IN MG/24 HOURS) 21 mg/24hr patch Place 1 patch (21 mg total) onto the skin daily. Patient not taking: Reported on 09/21/2022 07/13/22   Vannie Cornell R, CNM  Vitamin D , Ergocalciferol , (DRISDOL ) 1.25 MG (50000 UNIT) CAPS capsule Take 1 capsule (50,000 Units total) by mouth every 7 (seven) days. Weekly dose for 6 weeks then begin taking 5000IU per day. Patient not taking: Reported on 11/27/2023 07/14/22   Vannie Cornell SAUNDERS, CNM    Family History Family History  Problem Relation Age of Onset   Cancer Mother        cervical cancer   Diabetes Father    Cancer Maternal Aunt     Social History Social History   Tobacco Use  Smoking status: Light Smoker    Current packs/day: 1.00    Types: Cigarettes   Smokeless tobacco: Never  Vaping Use   Vaping status: Former  Substance Use Topics   Alcohol use: Yes   Drug use: Yes    Types: Marijuana     Allergies   Amoxicillin and Latex   Review of Systems Review of Systems Per HPI  Physical Exam Triage Vital Signs ED Triage Vitals  Encounter Vitals Group     BP 11/27/23 1303 106/67     Girls Systolic BP Percentile --      Girls Diastolic BP Percentile --      Boys Systolic BP Percentile --      Boys Diastolic BP Percentile --      Pulse Rate 11/27/23 1303 65      Resp 11/27/23 1303 18     Temp 11/27/23 1303 98.7 F (37.1 C)     Temp Source 11/27/23 1303 Oral     SpO2 11/27/23 1303 98 %     Weight --      Height --      Head Circumference --      Peak Flow --      Pain Score 11/27/23 1305 0     Pain Loc --      Pain Education --      Exclude from Growth Chart --    No data found.  Updated Vital Signs BP 106/67 (BP Location: Left Arm)   Pulse 65   Temp 98.7 F (37.1 C) (Oral)   Resp 18   LMP 11/27/2023 (Exact Date)   SpO2 98%   Visual Acuity Right Eye Distance:   Left Eye Distance:   Bilateral Distance:    Right Eye Near:   Left Eye Near:    Bilateral Near:     Physical Exam Vitals and nursing note reviewed.  Constitutional:      Appearance: She is not ill-appearing or toxic-appearing.  HENT:     Head: Normocephalic and atraumatic.     Right Ear: Hearing and external ear normal.     Left Ear: Hearing and external ear normal.     Nose: Nose normal.     Mouth/Throat:     Lips: Pink.  Eyes:     General: Lids are normal. Vision grossly intact. Gaze aligned appropriately.     Extraocular Movements: Extraocular movements intact.     Conjunctiva/sclera: Conjunctivae normal.  Pulmonary:     Effort: Pulmonary effort is normal.  Abdominal:     General: Bowel sounds are normal.     Palpations: Abdomen is soft.     Tenderness: There is no abdominal tenderness. There is no right CVA tenderness, left CVA tenderness or guarding.  Musculoskeletal:     Cervical back: Neck supple.  Skin:    General: Skin is warm and dry.     Capillary Refill: Capillary refill takes less than 2 seconds.     Findings: No rash.  Neurological:     General: No focal deficit present.     Mental Status: She is alert and oriented to person, place, and time. Mental status is at baseline.     Cranial Nerves: No dysarthria or facial asymmetry.  Psychiatric:        Mood and Affect: Mood normal.        Speech: Speech normal.        Behavior: Behavior  normal.        Thought Content: Thought  content normal.        Judgment: Judgment normal.      UC Treatments / Results  Labs (all labs ordered are listed, but only abnormal results are displayed) Labs Reviewed  POCT URINALYSIS DIP (MANUAL ENTRY) - Abnormal; Notable for the following components:      Result Value   Clarity, UA turbid (*)    Blood, UA moderate (*)    All other components within normal limits  CERVICOVAGINAL ANCILLARY ONLY    EKG   Radiology No results found.  Procedures Procedures (including critical care time)  Medications Ordered in UC Medications - No data to display  Initial Impression / Assessment and Plan / UC Course  I have reviewed the triage vital signs and the nursing notes.  Pertinent labs & imaging results that were available during my care of the patient were reviewed by me and considered in my medical decision making (see chart for details).   1.  Urinary frequency, antibiotic induced yeast infection Urinalysis shows moderate RBCs likely secondary to menses.  No other signs of urinary tract infection on urinalysis. Advised to continue pushing fluids and avoiding intake of urinary irritants.  Vaginal itching/irritation is likely secondary to antibiotic induced yeast infection.   Vaginal swab is pending to test for yeast vaginitis. Empiric treatment with Diflucan  has been ordered. Recommend follow-up with PCP for ongoing health maintenance.  Counseled patient on potential for adverse effects with medications prescribed/recommended today, strict ER and return-to-clinic precautions discussed, patient verbalized understanding.    Final Clinical Impressions(s) / UC Diagnoses   Final diagnoses:  Urinary frequency  Antibiotic-induced yeast infection     Discharge Instructions      Your urine is unremarkable for signs of UTI.  I suspect that you have a vaginal yeast infection due to taking the amoxicillin for UTI.  I have sent in  Diflucan  for you to take as prescribed 1 pill today and then 1 pill in 3 days.  Continue to avoid drinking urinary irritants such as sodas, caffeine, and other dehydrating beverages.  Continue to follow-up with your primary care provider for ongoing medical concerns.   ED Prescriptions     Medication Sig Dispense Auth. Provider   fluconazole  (DIFLUCAN ) 150 MG tablet Take 1 tablet (150 mg total) by mouth every 3 (three) days. 2 tablet Enedelia Dorna HERO, FNP      PDMP not reviewed this encounter.   Enedelia Dorna HERO, [place] 11/27/23 1349

## 2023-11-27 NOTE — ED Triage Notes (Signed)
 Pt st's she was dx with a UTI on 9/23  and was given Amoxicillin for same   Pt st's she only took it for 4 days and started to develop a rash and her PCP told her to stop taking it.  Pt st's she still is having frequency with small amounts

## 2023-11-28 LAB — CERVICOVAGINAL ANCILLARY ONLY
Candida Glabrata: NEGATIVE
Candida Vaginitis: NEGATIVE
Comment: NEGATIVE
Comment: NEGATIVE
